# Patient Record
Sex: Male | Born: 1965 | Race: White | Hispanic: No | Marital: Married | Smoking: Never smoker
Health system: Southern US, Community
[De-identification: ages and names within clinical notes are randomized; demographics above are authoritative.]

## PROBLEM LIST (undated history)

## (undated) DIAGNOSIS — E119 Type 2 diabetes mellitus without complications: Secondary | ICD-10-CM

## (undated) DIAGNOSIS — I10 Essential (primary) hypertension: Secondary | ICD-10-CM

---

## 1998-10-22 ENCOUNTER — Ambulatory Visit (HOSPITAL_COMMUNITY): Admission: RE | Admit: 1998-10-22 | Discharge: 1998-10-22 | Payer: Self-pay | Admitting: Obstetrics and Gynecology

## 2001-06-10 ENCOUNTER — Emergency Department (HOSPITAL_COMMUNITY): Admission: EM | Admit: 2001-06-10 | Discharge: 2001-06-10 | Payer: Self-pay | Admitting: Emergency Medicine

## 2001-06-12 ENCOUNTER — Emergency Department (HOSPITAL_COMMUNITY): Admission: EM | Admit: 2001-06-12 | Discharge: 2001-06-12 | Payer: Self-pay | Admitting: *Deleted

## 2016-05-25 ENCOUNTER — Inpatient Hospital Stay (HOSPITAL_COMMUNITY)
Admission: EM | Admit: 2016-05-25 | Discharge: 2016-05-29 | DRG: 880 | Disposition: A | Discharge status: Home / Self Care | Payer: Federal, State, Local not specified - PPO | Source: Intra-hospital | Attending: Psychiatry | Admitting: Psychiatry

## 2016-05-25 ENCOUNTER — Encounter (HOSPITAL_COMMUNITY): Payer: Self-pay | Admitting: *Deleted

## 2016-05-25 DIAGNOSIS — E559 Vitamin D deficiency, unspecified: Secondary | ICD-10-CM | POA: Diagnosis present

## 2016-05-25 DIAGNOSIS — F05 Delirium due to known physiological condition: Secondary | ICD-10-CM | POA: Diagnosis present

## 2016-05-25 DIAGNOSIS — Z880 Allergy status to penicillin: Secondary | ICD-10-CM | POA: Diagnosis not present

## 2016-05-25 DIAGNOSIS — F22 Delusional disorders: Secondary | ICD-10-CM | POA: Diagnosis present

## 2016-05-25 DIAGNOSIS — F3130 Bipolar disorder, current episode depressed, mild or moderate severity, unspecified: Secondary | ICD-10-CM

## 2016-05-25 DIAGNOSIS — Z818 Family history of other mental and behavioral disorders: Secondary | ICD-10-CM | POA: Diagnosis not present

## 2016-05-25 DIAGNOSIS — E291 Testicular hypofunction: Secondary | ICD-10-CM | POA: Diagnosis present

## 2016-05-25 DIAGNOSIS — E1165 Type 2 diabetes mellitus with hyperglycemia: Secondary | ICD-10-CM | POA: Diagnosis present

## 2016-05-25 DIAGNOSIS — J45909 Unspecified asthma, uncomplicated: Secondary | ICD-10-CM | POA: Diagnosis present

## 2016-05-25 DIAGNOSIS — S0083XA Contusion of other part of head, initial encounter: Secondary | ICD-10-CM | POA: Diagnosis present

## 2016-05-25 DIAGNOSIS — N390 Urinary tract infection, site not specified: Secondary | ICD-10-CM | POA: Diagnosis present

## 2016-05-25 DIAGNOSIS — Z7984 Long term (current) use of oral hypoglycemic drugs: Secondary | ICD-10-CM

## 2016-05-25 DIAGNOSIS — E119 Type 2 diabetes mellitus without complications: Secondary | ICD-10-CM

## 2016-05-25 DIAGNOSIS — F411 Generalized anxiety disorder: Secondary | ICD-10-CM | POA: Diagnosis present

## 2016-05-25 DIAGNOSIS — Z833 Family history of diabetes mellitus: Secondary | ICD-10-CM

## 2016-05-25 DIAGNOSIS — Z79899 Other long term (current) drug therapy: Secondary | ICD-10-CM | POA: Diagnosis not present

## 2016-05-25 DIAGNOSIS — F32A Depression, unspecified: Secondary | ICD-10-CM | POA: Diagnosis present

## 2016-05-25 DIAGNOSIS — S0012XA Contusion of left eyelid and periocular area, initial encounter: Secondary | ICD-10-CM | POA: Diagnosis present

## 2016-05-25 DIAGNOSIS — E785 Hyperlipidemia, unspecified: Secondary | ICD-10-CM | POA: Diagnosis present

## 2016-05-25 DIAGNOSIS — F329 Major depressive disorder, single episode, unspecified: Secondary | ICD-10-CM | POA: Diagnosis present

## 2016-05-25 DIAGNOSIS — F319 Bipolar disorder, unspecified: Secondary | ICD-10-CM | POA: Clinically undetermined

## 2016-05-25 DIAGNOSIS — I10 Essential (primary) hypertension: Secondary | ICD-10-CM | POA: Diagnosis present

## 2016-05-25 LAB — GLUCOSE, CAPILLARY
Glucose-Capillary: 123 mg/dL — ABNORMAL HIGH (ref 65–99)
Glucose-Capillary: 100 mg/dL — ABNORMAL HIGH (ref 65–99)

## 2016-05-25 MED ORDER — GLUCERNA SHAKE PO LIQD
237.0000 mL | Freq: Three times a day (TID) | ORAL | Status: DC
  Administered 2016-05-25 – 2016-05-29 (×12): 237 mL via ORAL

## 2016-05-25 MED ORDER — INSULIN ASPART 100 UNIT/ML ~~LOC~~ SOLN: 0.0000 [IU] | Freq: Every day | SUBCUTANEOUS | Status: DC

## 2016-05-25 MED ORDER — PNEUMOCOCCAL VAC POLYVALENT 25 MCG/0.5ML IJ INJ
0.5000 mL | INJECTION | INTRAMUSCULAR | Status: AC
  Administered 2016-05-26: 0.5 mL via INTRAMUSCULAR

## 2016-05-25 MED ORDER — HALOPERIDOL 5 MG PO TABS
2.5000 mg | ORAL_TABLET | Freq: Every day | ORAL | Status: DC
  Administered 2016-05-26 – 2016-05-29 (×4): 2.5 mg via ORAL
  Filled 2016-05-25 (×7): qty 1

## 2016-05-25 MED ORDER — OLANZAPINE 5 MG PO TABS
5.0000 mg | ORAL_TABLET | Freq: Four times a day (QID) | ORAL | Status: DC | PRN
  Administered 2016-05-26: 5 mg via ORAL
  Filled 2016-05-25: qty 2

## 2016-05-25 MED ORDER — OLANZAPINE 10 MG IM SOLR: 10.0000 mg | Freq: Three times a day (TID) | INTRAMUSCULAR | Status: DC | PRN

## 2016-05-25 MED ORDER — FAMOTIDINE 20 MG PO TABS
20.0000 mg | ORAL_TABLET | Freq: Two times a day (BID) | ORAL | Status: DC
  Administered 2016-05-25 – 2016-05-29 (×8): 20 mg via ORAL
  Filled 2016-05-25 (×12): qty 1

## 2016-05-25 MED ORDER — HYDROCHLOROTHIAZIDE 12.5 MG PO CAPS
12.5000 mg | ORAL_CAPSULE | Freq: Every day | ORAL | Status: DC
  Administered 2016-05-26: 12.5 mg via ORAL
  Filled 2016-05-25 (×2): qty 1

## 2016-05-25 MED ORDER — OLANZAPINE 10 MG IM SOLR: 5.0000 mg | Freq: Four times a day (QID) | INTRAMUSCULAR | Status: DC | PRN

## 2016-05-25 MED ORDER — OLANZAPINE 10 MG PO TABS: 10.0000 mg | ORAL_TABLET | Freq: Three times a day (TID) | ORAL | Status: DC | PRN

## 2016-05-25 MED ORDER — DULOXETINE HCL 60 MG PO CPEP
60.0000 mg | ORAL_CAPSULE | Freq: Every day | ORAL | Status: DC
  Filled 2016-05-25 (×2): qty 1

## 2016-05-25 MED ORDER — ALUM & MAG HYDROXIDE-SIMETH 200-200-20 MG/5ML PO SUSP
30.0000 mL | ORAL | Status: DC | PRN
  Administered 2016-05-28: 30 mL via ORAL
  Filled 2016-05-25: qty 30

## 2016-05-25 MED ORDER — INSULIN ASPART 100 UNIT/ML ~~LOC~~ SOLN
0.0000 [IU] | Freq: Three times a day (TID) | SUBCUTANEOUS | Status: DC
  Administered 2016-05-26 – 2016-05-28 (×2): 3 [IU] via SUBCUTANEOUS

## 2016-05-25 MED ORDER — HYDROXYZINE HCL 25 MG PO TABS
25.0000 mg | ORAL_TABLET | Freq: Four times a day (QID) | ORAL | Status: DC | PRN
  Administered 2016-05-26 – 2016-05-29 (×3): 25 mg via ORAL
  Filled 2016-05-25 (×3): qty 1

## 2016-05-25 MED ORDER — SULFAMETHOXAZOLE-TRIMETHOPRIM 800-160 MG PO TABS
1.0000 | ORAL_TABLET | Freq: Two times a day (BID) | ORAL | Status: DC
  Administered 2016-05-25 – 2016-05-29 (×9): 1 via ORAL
  Filled 2016-05-25 (×15): qty 1

## 2016-05-25 MED ORDER — HALOPERIDOL 5 MG PO TABS
2.5000 mg | ORAL_TABLET | Freq: Every evening | ORAL | Status: DC
  Administered 2016-05-25 – 2016-05-26 (×2): 2.5 mg via ORAL
  Filled 2016-05-25 (×3): qty 1

## 2016-05-25 MED ORDER — VITAMIN D3 25 MCG (1000 UNIT) PO TABS
1000.0000 [IU] | ORAL_TABLET | Freq: Every day | ORAL | Status: DC
  Administered 2016-05-26 – 2016-05-29 (×4): 1000 [IU] via ORAL
  Filled 2016-05-25 (×7): qty 1

## 2016-05-25 MED ORDER — MAGNESIUM HYDROXIDE 400 MG/5ML PO SUSP: 30.0000 mL | Freq: Every day | ORAL | Status: DC | PRN

## 2016-05-25 MED ORDER — METFORMIN HCL ER 500 MG PO TB24
500.0000 mg | ORAL_TABLET | Freq: Every day | ORAL | Status: DC
  Administered 2016-05-26 – 2016-05-29 (×4): 500 mg via ORAL
  Filled 2016-05-25 (×6): qty 1

## 2016-05-25 MED ORDER — DULOXETINE HCL 30 MG PO CPEP
30.0000 mg | ORAL_CAPSULE | Freq: Every day | ORAL | Status: DC
  Administered 2016-05-26 – 2016-05-29 (×4): 30 mg via ORAL
  Filled 2016-05-25 (×7): qty 1

## 2016-05-25 MED ORDER — TRAZODONE HCL 50 MG PO TABS
50.0000 mg | ORAL_TABLET | Freq: Every evening | ORAL | Status: DC | PRN
  Administered 2016-05-25 – 2016-05-27 (×3): 50 mg via ORAL
  Filled 2016-05-25 (×3): qty 1

## 2016-05-25 MED ORDER — ACETAMINOPHEN 325 MG PO TABS: 650.0000 mg | ORAL_TABLET | Freq: Four times a day (QID) | ORAL | Status: DC | PRN
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
## 2016-05-25 NOTE — [facility] Suicide Risk Assessment (Signed)
[facility]   Nursing information obtained from:    Demographic factors:    Current Mental Status:    Loss Factors:    Historical Factors:    Risk Reduction Factors:     Total Time spent with patient: 30 minutes Principal Problem: Bipolar disord, crnt epsd depress, mild or mod severt, unsp (HCC) Diagnosis:   Patient Active Problem List   Diagnosis Date Noted  . Bipolar disord, crnt epsd depress, mild or mod severt, unsp (HCC) [F31.30] 05/25/2016  . GAD (generalized anxiety disorder) [F41.1] 05/25/2016  . Vitamin D deficiency [E55.9] 05/25/2016  . Hypogonadism male [E29.1] 05/25/2016  . Diabetes mellitus (HCC) [E11.9] 05/25/2016  . Hyperlipidemia [E78.5] 05/25/2016  . Essential hypertension [I10] 05/25/2016   Subjective Data: Please see H&P.   Continued Clinical Symptoms:  Alcohol Use Disorder Identification Test Final Score (AUDIT): 0 The "Alcohol Use Disorders Identification Test", Guidelines for Use in Primary Care, Second Edition.  World Science writerHealth Organization [facility](WHO). Score between 0-7:  no or low risk or alcohol related problems. Score between 8-15:  moderate risk of alcohol related problems. Score between 16-19:  high risk of alcohol related problems. Score 20 or above:  warrants further diagnostic evaluation for alcohol dependence and treatment.   CLINICAL FACTORS:   Previous Psychiatric Diagnoses and Treatments Medical Diagnoses and Treatments/Surgeries   Musculoskeletal: Strength & Muscle Tone: within normal limits Gait & Station: seen in bed - per staff noted as normal gait Patient leans: N/A  Psychiatric Specialty Exam: Physical Exam  ROS  Blood pressure 106/65, pulse (!) 115, temperature 98 F (36.7 C), temperature source Oral, resp. rate 18, height 5' 11.25" (1.81 m), weight 105.3 kg (232 lb 4 oz).Body mass index is 32.17 kg/m.            Please see H&P.                                                COGNITIVE FEATURES THAT CONTRIBUTE TO RISK:  Closed-mindedness, Polarized thinking and Thought constriction (tunnel vision)    SUICIDE RISK:   Moderate:  Frequent suicidal ideation with limited intensity, and duration, some specificity in terms of plans, no associated intent, good self-control, limited dysphoria/symptomatology, some risk factors present, and identifiable protective factors, including available and accessible social support.   PLAN OF CARE: Please see H&P.   I certify that inpatient services furnished can reasonably be expected to improve the patient's condition.  Niralya Ohanian, MD 05/25/2016, 2:14 PM
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
## 2016-05-25 NOTE — Progress Notes (Signed)
Did not attend group 

## 2016-05-25 NOTE — [facility] Suicide Risk Assessment (Signed)
[facility] INPATIENT:  Family/Significant Other Suicide Prevention Education  Suicide Prevention Education:  Education Completed; No one has been identified by the patient as the family member/significant other with whom the patient will be residing, and identified as the person(s) who will aid the patient in the event of a mental health crisis (suicidal ideations/suicide attempt).  With written consent from the patient, the family member/significant other has been provided the following suicide prevention education, prior to the and/or following the discharge of the patient.  The suicide prevention education provided includes the following:  Suicide risk factors  Suicide prevention and interventions  National Suicide Hotline telephone number  [facility] assessment telephone number  [facility] Emergency Assistance 911  [facility] and/or Residential Mobile Crisis Unit telephone number  Request made of family/significant other to:  Remove weapons (e.g., guns, rifles, knives), all items previously/currently identified as safety concern.    Remove drugs/medications (over-the-counter, prescriptions, illicit drugs), all items previously/currently identified as a safety concern.  The family member/significant other verbalizes understanding of the suicide prevention education information provided.  The family member/significant other agrees to remove the items of safety concern listed above. The patient did not endorse SI at the time of admission, nor did the patient c/o SI during the stay here.  SPE not required.   Ida RogueRodney B Cassidie Veiga 05/25/2016, 2:25 PM

## 2016-05-26 ENCOUNTER — Encounter (HOSPITAL_COMMUNITY): Payer: Self-pay | Admitting: Psychiatry

## 2016-05-26 DIAGNOSIS — I10 Essential (primary) hypertension: Secondary | ICD-10-CM

## 2016-05-26 LAB — COMPREHENSIVE METABOLIC PANEL
ALT: 51 U/L (ref 17–63)
AST: 51 U/L — ABNORMAL HIGH (ref 15–41)
Albumin: 4.4 g/dL (ref 3.5–5.0)
Alkaline Phosphatase: 68 U/L (ref 38–126)
Anion gap: 8 (ref 5–15)
BUN: 19 mg/dL (ref 6–20)
CO2: 29 mmol/L (ref 22–32)
Calcium: 9.8 mg/dL (ref 8.9–10.3)
Chloride: 103 mmol/L (ref 101–111)
Creatinine, Ser: 1.43 mg/dL — ABNORMAL HIGH (ref 0.61–1.24)
GFR calc Af Amer: >60 mL/min (ref >60)
GFR calc non Af Amer: 55 mL/min — ABNORMAL LOW (ref >60)
Glucose, Bld: 123 mg/dL — ABNORMAL HIGH (ref 65–99)
Potassium: 4.2 mmol/L (ref 3.5–5.1)
Sodium: 140 mmol/L (ref 135–145)
Total Bilirubin: 0.8 mg/dL (ref 0.3–1.2)
Total Protein: 7.7 g/dL (ref 6.5–8.1)

## 2016-05-26 LAB — GLUCOSE, CAPILLARY
Glucose-Capillary: 122 mg/dL — ABNORMAL HIGH (ref 65–99)
Glucose-Capillary: 104 mg/dL — ABNORMAL HIGH (ref 65–99)
Glucose-Capillary: 99 mg/dL (ref 65–99)
Glucose-Capillary: 136 mg/dL — ABNORMAL HIGH (ref 65–99)

## 2016-05-26 LAB — URINALYSIS W MICROSCOPIC (NOT AT ARMC)
Bilirubin Urine: NEGATIVE
Glucose, UA: NEGATIVE mg/dL
Hgb urine dipstick: NEGATIVE
Ketones, ur: 15 mg/dL — AB
Leukocytes, UA: NEGATIVE
Nitrite: NEGATIVE
Protein, ur: NEGATIVE mg/dL
RBC / HPF: NONE SEEN RBC/hpf (ref 0–5)
Specific Gravity, Urine: 1.019 (ref 1.005–1.030)
WBC, UA: NONE SEEN WBC/hpf (ref 0–5)
pH: 6 (ref 5.0–8.0)

## 2016-05-26 LAB — RPR: RPR Ser Ql: NONREACTIVE

## 2016-05-26 LAB — CBC WITH DIFFERENTIAL/PLATELET
Basophils Absolute: 0.1 10*3/uL (ref 0.0–0.1)
Basophils Relative: 1 %
Eosinophils Absolute: 0.2 10*3/uL (ref 0.0–0.7)
Eosinophils Relative: 2 %
HCT: 46 % (ref 39.0–52.0)
Hemoglobin: 16.2 g/dL (ref 13.0–17.0)
Lymphocytes Relative: 29 %
Lymphs Abs: 2.3 10*3/uL (ref 0.7–4.0)
MCH: 32.2 pg (ref 26.0–34.0)
MCHC: 35.2 g/dL (ref 30.0–36.0)
MCV: 91.5 fL (ref 78.0–100.0)
Monocytes Absolute: 0.9 10*3/uL (ref 0.1–1.0)
Monocytes Relative: 12 %
Neutro Abs: 4.5 10*3/uL (ref 1.7–7.7)
Neutrophils Relative %: 56 %
Platelets: 316 10*3/uL (ref 150–400)
RBC: 5.03 MIL/uL (ref 4.22–5.81)
RDW: 13.5 % (ref 11.5–15.5)
WBC: 7.9 10*3/uL (ref 4.0–10.5)

## 2016-05-26 LAB — VITAMIN B12: Vitamin B-12: 431 pg/mL (ref 180–914)

## 2016-05-26 LAB — LIPID PANEL
Cholesterol: 170 mg/dL (ref 0–200)
HDL: 28 mg/dL — ABNORMAL LOW (ref >40)
LDL Cholesterol: 110 mg/dL — ABNORMAL HIGH (ref 0–99)
Total CHOL/HDL Ratio: 6.1 RATIO
Triglycerides: 159 mg/dL — ABNORMAL HIGH (ref <150)
VLDL: 32 mg/dL (ref 0–40)

## 2016-05-26 LAB — FOLATE: Folate: 16.9 ng/mL (ref >5.9)

## 2016-05-26 LAB — TSH: TSH: 1.539 u[IU]/mL (ref 0.350–4.500)

## 2016-05-26 MED ORDER — METOPROLOL TARTRATE 25 MG PO TABS
25.0000 mg | ORAL_TABLET | Freq: Two times a day (BID) | ORAL | Status: DC
  Administered 2016-05-26 – 2016-05-29 (×6): 25 mg via ORAL
  Filled 2016-05-26 (×10): qty 1

## 2016-05-26 MED ORDER — LISINOPRIL 5 MG PO TABS
5.0000 mg | ORAL_TABLET | Freq: Every day | ORAL | Status: DC
  Administered 2016-05-26 – 2016-05-29 (×4): 5 mg via ORAL
  Filled 2016-05-26 (×7): qty 1

## 2016-05-26 NOTE — Progress Notes (Signed)
Phone consult  [facility] Aggie for Dr Elna BreslowEappen, regarding elevated creatinine 1.43, and hypertension. Patient has a history of diabetes mellitus Patient has been taking HCTZ 12.5 mg daily along with lisinopril 20 mg daily at home.  In the hospital he was not prescribed his medications, mild elevation of blood pressure 134/91, heart rate 117 recommended to start metoprolol 25 mg twice a day, along with lisinopril 5 mg daily. Okay to hold HCTZ. Recheck BMP in 3 days  Patient can be discharged on metoprolol 25 mg twice a day, lisinopril 5 mg daily. Discontinue HCTZ, lisinopril 20 mg. He will need outpatient follow-up with nephrology.

## 2016-05-26 NOTE — Progress Notes (Signed)
Recreation Therapy Notes  INPATIENT RECREATION THERAPY ASSESSMENT  Patient Details Name: Ethan Arnold MRN: [id number] DOB: Jun 04, 1966 Today's Date: 05/26/2016  Patient Stressors: Family, Other (Comment) (Buying a car)  Pt stated he didn't know why he was here. Pt stated his relationship with his siblings and mother has been a stressor.  Coping Skills:   Isolate, Arguments, Avoidance, Music, Sports  Pt stated he likes to play softball.  Personal Challenges: Anger, Communication, Concentration, Decision-Making, Expressing Yourself, Relationships, Self-Esteem/Confidence, Stress Management  Leisure Interests (2+):  Sports - Baseball, Individual - Reading  Awareness of Community Resources:  Yes  Community Resources:  Library, Recreation Center  Current Use: No  If no, Barriers?: Other (Comment) Stage manager(Procastinator)  Patient Strengths:  People person, decent athlete  Patient Identified Areas of Improvement:  Talking when meeting new person; verbalizing feelings  Current Recreation Participation:  Twice a week  Patient Goal for Hospitalization:  "Hurry up and get out"  [place]  [place] Residence[place]   Current SI (including self-harm):  No  Current HI:  No  Consent to Intern Participation: N/A   Caroll RancherMarjette Jammie Clink, LRT/CTRS  Caroll RancherLindsay, Sunita Demond A 05/26/2016, 12:40 PM

## 2016-05-26 NOTE — Progress Notes (Signed)
DAR NOTE: Patient presents with anxious affect and depressed mood.  Pt appear confused, pt was observed responding to internal stimuli, standing at the corner in the day room singing and dancing during group meeting. Denies pain, auditory and visual hallucinations.  Rates depression at 2, hopelessness at 1, and anxiety at 3.  Maintained on routine safety checks.  Medications given as prescribed.  Support and encouragement offered as needed. States goal for today is " getting well."  Patient observed socializing with peers in the dayroom.  Offered no complaint.

## 2016-05-26 NOTE — [facility] Counselor (Signed)
Adult Comprehensive Assessment  Patient ID: Ethan Arnold, male   DOB: Jan 25, 1966, 50 y.o.   MRN: [id number]  Information Source: Information source: Patient  Current Stressors:  Family Relationships: Distant relationship with his mother and siblings.  Physical health (include injuries & life threatening diseases): Patient charts shows that he has an UTI, uncontrolled diabetes, and vitamin D deficiancy. Bereavement / Loss: Patient reported that his father passed away in 27.  Living/Environment/Situation:  Living Arrangements: Spouse/significant other Living conditions (as described by patient or guardian): Patient described his living conditions as "great".  How long has patient lived in current situation?: "roughly 20 years" What is atmosphere in current home: Supportive, Loving, Comfortable  Family History:  Marital status: Married Number of Years Married: 19 What types of issues is patient dealing with in the relationship?: None  Are you sexually active?: Yes What is your sexual orientation?: Heterosexual  Has your sexual activity been affected by drugs, alcohol, medication, or emotional stress?: NO  Does patient have children?: Yes (Patient has a 98 year old step-son. ) How many children?: 1 How is patient's relationship with their children?: Patient reported having a decent relationship with his stepson. Patient does not have any biological children.   Childhood History:  By whom was/is the patient raised?: Both parents Additional childhood history information: Patient's father passed away in 55.  Description of patient's relationship with caregiver when they were a child: Patient described his relationship with his cargivers during his childhood as "great".  Patient's description of current relationship with people who raised him/her: Patient stated that his relationship with his mother is currently "rocky".  Does patient have siblings?: Yes Number of Siblings:  4 Description of patient's current relationship with siblings: Patient described his relationship with his siblings as "distant".  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 - [employer]  How long has patient been employed?: 27 years  Patient's job has been impacted by current illness: Yes Describe how patient's job has been impacted: "By being in the hosiptal"  What is the longest time patient has a held a job?: 27 years  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  Has patient ever been in the [employer]?: No Has patient ever served in combat?: No Did You Receive Any Psychiatric Treatment/Services While in Equities trader?: No Are There Guns or Other Weapons in Your Home?: No  Financial Resources:   Financial resources: Income from employment, Private insurance Does patient have a representative payee or guardian?: No  Alcohol/Substance Abuse:   What has been your use of drugs/alcohol within the last 12 months?: Patient denies any alcohol or substance abuse  If attempted suicide, did drugs/alcohol play a role in this?: No Alcohol/Substance Abuse Treatment Hx: Denies past history Has alcohol/substance abuse ever caused legal problems?: No  Social Support System:   Patient's Community Support System: Good Describe Community Support System: "My wife is my support, and she is great"  Type of faith/religion:  Christianity  How does  patient's faith help to cope with current illness?: Prayer; Attending church   Leisure/Recreation:   Leisure and Hobbies: Watching professional baseball and listening to blue grass music   Strengths/Needs:   What things does the patient do well?: "Im a people person and I have a good sense of humor"  In what areas does patient struggle / problems for patient: "I need to read the bible more and get a better understanding of everything"   Discharge Plan:   Does patient have access to transportation?: Yes (Uncertain, "my wife will figure something out". ) Will patient be returning to same living situation after discharge?: Yes Currently receiving community mental health services: No If no, would patient like referral for services when discharged?: Yes (What county?) [facility]( ) Does patient have financial barriers related to discharge medications?: No  Summary/Recommendations:    Summary and Recommendations (to be completed by the evaluator): Ethan Arnold is a 50 year old, Caucasian male who is diagnosed with R/O Bipolar disorder (HCC): R/O Delirium , multifactorial ( UTI , diabetes uncontrolled, vitamin d def and so on). Ethan Arnold presented to the hospital involuntarily [facility]Jody Os[facility]. Ethan Arnold's wife reported that he was behaving bizzarely  and had been manic 3 days prior to [facility]. During the PSA, Ethan Arnold exhibited rapid speech and flight of ideas, but was still able to provide valid information for the assessment. Ethan Arnold stated that this was the first time anything like this has happened to him, and that he feels better today as some of his confusion continues to clears. He stated that he would like to be referred to a psychiatrist outside of the hospital so that he can "stay on the right track". Ethan Arnold can benefit from crisis stabilization, medication management, therapeutic milieu and referral services.  Ethan Arnold. 05/26/2016

## 2016-05-26 NOTE — Tx Team (Signed)
Interdisciplinary Treatment and Diagnostic Plan Update  05/26/2016 Time of Session: 10:44 AM  Ethan Arnold MRN: [id number]  Principal Diagnosis: Bipolar disorder ([place])  Secondary Diagnoses: Principal Problem:   Bipolar disorder ([place]) Active Problems:   GAD (generalized anxiety disorder)   Vitamin D deficiency   Hypogonadism male   Diabetes mellitus ([place])   Hyperlipidemia   Essential hypertension   UTI (urinary tract infection)   Current Medications:  Current Facility-Administered Medications  Medication Dose Route Frequency Provider Last Rate Last Dose  . acetaminophen (TYLENOL) tablet 650 mg  650 mg Oral Q6H PRN Ursula Alert, MD      . alum & mag hydroxide-simeth (MAALOX/MYLANTA) 200-200-20 MG/5ML suspension 30 mL  30 mL Oral Q4H PRN Ursula Alert, MD      . cholecalciferol (VITAMIN D) tablet 1,000 Units  1,000 Units Oral Daily Ursula Alert, MD   1,000 Units at 05/26/16 8638  . DULoxetine (CYMBALTA) DR capsule 30 mg  30 mg Oral Daily Saramma Eappen, MD   30 mg at 05/26/16 0821  . famotidine (PEPCID) tablet 20 mg  20 mg Oral BID Ursula Alert, MD   20 mg at 05/26/16 0821  . feeding supplement (GLUCERNA SHAKE) (GLUCERNA SHAKE) liquid 237 mL  237 mL Oral TID BM Saramma Eappen, MD   237 mL at 05/26/16 1022  . haloperidol (HALDOL) tablet 2.5 mg  2.5 mg Oral QPM Saramma Eappen, MD   2.5 mg at 05/25/16 1702  . haloperidol (HALDOL) tablet 2.5 mg  2.5 mg Oral Daily Saramma Eappen, MD   2.5 mg at 05/26/16 0820  . hydrochlorothiazide (MICROZIDE) capsule 12.5 mg  12.5 mg Oral Daily Saramma Eappen, MD   12.5 mg at 05/26/16 0821  . hydrOXYzine (ATARAX/VISTARIL) tablet 25 mg  25 mg Oral Q6H PRN Saramma Eappen, MD      . insulin aspart (novoLOG) injection 0-20 Units  0-20 Units Subcutaneous TID WC Ursula Alert, MD   3 Units at 05/26/16 0728  . insulin aspart (novoLOG) injection 0-5 Units  0-5 Units Subcutaneous QHS Ursula Alert, MD   Stopped at 05/25/16 2200  . magnesium hydroxide (MILK  OF MAGNESIA) suspension 30 mL  30 mL Oral Daily PRN Ursula Alert, MD      . metFORMIN (GLUCOPHAGE-XR) 24 hr tablet 500 mg  500 mg Oral Q breakfast Ursula Alert, MD   500 mg at 05/26/16 0820  . OLANZapine (ZYPREXA) tablet 5 mg  5 mg Oral Q6H PRN Ursula Alert, MD   5 mg at 05/26/16 1039   Or  . OLANZapine (ZYPREXA) injection 5 mg  5 mg Intramuscular Q6H PRN Ursula Alert, MD      . sulfamethoxazole-trimethoprim (BACTRIM DS,SEPTRA DS) 800-160 MG per tablet 1 tablet  1 tablet Oral Q12H Ursula Alert, MD   1 tablet at 05/26/16 0821  . traZODone (DESYREL) tablet 50 mg  50 mg Oral QHS PRN Ursula Alert, MD   50 mg at 05/25/16 2127    PTA Medications: Prescriptions Prior to Admission  Medication Sig Dispense Refill Last Dose  . albuterol (PROAIR HFA) 108 (90 Base) MCG/ACT inhaler Inhale 2 puffs into the lungs every 4 (four) hours as needed for wheezing or shortness of breath.      . Azelastine HCl 0.15 % SOLN Place 1 spray into the nose daily.      Marland Kitchen desloratadine (CLARINEX) 5 MG tablet Take 5 mg by mouth daily.      . DULoxetine (CYMBALTA) 60 MG capsule Take 60 mg by mouth  daily.      . hydrochlorothiazide (MICROZIDE) 12.5 MG capsule Take 12.5 mg by mouth daily.      Marland Kitchen HYDROcodone-homatropine (HYCODAN) 5-1.5 MG/5ML syrup Take 5 mLs by mouth at bedtime as needed for cough.      Marland Kitchen lisinopril (PRINIVIL,ZESTRIL) 40 MG tablet Take 20 mg by mouth daily.      . metFORMIN (GLUCOPHAGE-XR) 500 MG 24 hr tablet Take 500 mg by mouth daily before breakfast.      . mometasone (NASONEX) 50 MCG/ACT nasal spray Place 2 sprays into the nose daily.      . ranitidine (ZANTAC) 300 MG tablet Take 300 mg by mouth daily.      Marland Kitchen testosterone cypionate (DEPO-TESTOSTERONE) 200 MG/ML injection Inject 100 mg into the muscle See admin instructions. Take every 3 weeks.     . traZODone (DESYREL) 100 MG tablet Take 1/2-1 tablet po prn insomnia     . Vitamin D, Ergocalciferol, (DRISDOL) 50000 units CAPS capsule Take 50,000  Units by mouth every 7 (seven) days.        Treatment Modalities: Medication Management, Group therapy, Case management,  1 to 1 session with clinician, Psychoeducation, Recreational therapy.   Physician Treatment Plan for Primary Diagnosis: Bipolar disorder ([place]) Long Term Goal(s): Improvement in symptoms so as ready for discharge  Short Term Goals: Ability to verbalize feelings will improve  Medication Management: Evaluate patient's response, side effects, and tolerance of medication regimen.  Therapeutic Interventions: 1 to 1 sessions, Unit Group sessions and Medication administration.  Evaluation of Outcomes: Not Met  Physician Treatment Plan for Secondary Diagnosis: Principal Problem:   Bipolar disorder ([place]) Active Problems:   GAD (generalized anxiety disorder)   Vitamin D deficiency   Hypogonadism male   Diabetes mellitus ([place])   Hyperlipidemia   Essential hypertension   UTI (urinary tract infection)   Long Term Goal(s): Improvement in symptoms so as ready for discharge  Short Term Goals: Ability to identify and develop effective coping behaviors will improve  Medication Management: Evaluate patient's response, side effects, and tolerance of medication regimen.  Therapeutic Interventions: 1 to 1 sessions, Unit Group sessions and Medication administration.  Evaluation of Outcomes: Not Met   RN Treatment Plan for Primary Diagnosis: Bipolar disorder ([place]) Long Term Goal(s): Knowledge of disease and therapeutic regimen to maintain health will improve  Short Term Goals: Ability to remain free from injury will improve and Compliance with prescribed medications will improve  Medication Management: RN will administer medications as ordered by provider, will assess and evaluate patient's response and provide education to patient for prescribed medication. RN will report any adverse and/or side effects to prescribing provider.  Therapeutic Interventions: 1 on 1 counseling  sessions, Psychoeducation, Medication administration, Evaluate responses to treatment, Monitor vital signs and CBGs as ordered, Perform/monitor CIWA, COWS, AIMS and Fall Risk screenings as ordered, Perform wound care treatments as ordered.  Evaluation of Outcomes: Not Met   LCSW Treatment Plan for Primary Diagnosis: Bipolar disorder [facility]) Long Term Goal(s): Safe transition to appropriate next level of care at discharge, Engage patient in therapeutic group addressing interpersonal concerns.  Short Term Goals: Engage patient in aftercare planning with referrals and resources and Facilitate acceptance of mental health diagnosis and concerns  Therapeutic Interventions: Assess for all discharge needs, 1 to 1 time with Social worker, Explore available resources and support systems, Assess for adequacy in community support network, Educate family and significant other(s) on suicide prevention, Complete Psychosocial Assessment, Interpersonal group therapy.  Evaluation of Outcomes:  Not Met   Progress in Treatment: Attending groups: Yes Participating in groups: Yes Taking medication as prescribed: Yes, MD continues to assess for medication changes as needed Toleration medication: Yes, no side effects reported at this time Family/Significant other contact made: No  Patient understands diagnosis: Yes, patient requested for a referral to see a psychiatrist once he leaves hospital.  Discussing patient identified problems/goals with staff: Yes Medical problems stabilized or resolved: Yes Denies suicidal/homicidal ideation:  Issues/concerns per patient self-inventory: None Other: N/A  New problem(s) identified: None identified at this time.   New Short Term/Long Term Goal(s): None identified at this time.   Discharge Plan or Barriers: Return home, follow up outpatient   Reason for Continuation of Hospitalization: Anxiety Delusions  Hallucinations Mania Medication stabilization  Estimated  Length of Stay: 3-5 days  Attendees: Patient: 05/26/2016  10:44 AM  Physician: Dr. Shea Evans 05/26/2016  10:44 AM  Nursing:  Opal Sidles  05/26/2016  10:44 AM  RN Care Manager: Lars Pinks 05/26/2016  10:44 AM  Social Worker: Ripley Fraise, LCSW 05/26/2016  10:44 AM  Recreational Therapist: Winfield Cunas 05/26/2016  10:44 AM  Other: Radonna Ricker, Social Work Intern  05/26/2016  10:44 AM  Other:  05/26/2016  10:44 AM  Other: 05/26/2016  10:44 AM    Scribe for Treatment Team: Radonna Ricker, Social Work Intern 05/26/2016 10:44 AM

## 2016-05-26 NOTE — Progress Notes (Signed)
D: Ethan Arnold has been resting in his room tonight. He was asleep during group this evening. Denies SI/HI/AVH at this time. Contracts for safety.   A: Encouragement and support given. Q15 minute room checks for patient safety. Medications administered as prescribed.   R: Continue to monitor for patient safety and medication effectiveness.

## 2016-05-26 NOTE — Progress Notes (Signed)
[facility] MD Progress Note  05/26/2016 1:24 PM Ethan Arnold  MRN:  [id number] Subjective: Patient states " I remember everything that happened , but I still have trouble determining what was real and what was not. Nothing unusual was going on except that we were going to buy a new car. I was not hearing voices , but in was seeing things ,like seeing people walking /"  Objective:Erica L Hurleyis a 50 y.o.caucasian male who is married , lives with his wife - Caren Griffins of 22 + years , is employed as a Development worker, community carrier , has a hx of anxiety disorder , who has never been admitted at an IP mental health facility before , presented to [facility] hospital ED after having some bizarre behavior , sleep issues and agitation at home.  Patient seen and chart reviewed.Discussed patient with treatment team.  Pt today is seen as alert, oriented x4 , appears calm and appropriate. Pt denies any AH , but still continues to have some derealization sx - unable to differentiate between real and unreal. Pt seen as more visible in milieu . Pt per staff is compliant on medications , does appear "confused " at times. Continues to need encouragement and support.   Principal Problem:R/O  Bipolar disorder ([place]); R/O Delirium , multifactorial ( UTI , diabetes uncontrolled, vitamin d def and so on) Diagnosis:   Patient Active Problem List   Diagnosis Date Noted  . GAD (generalized anxiety disorder) [F41.1] 05/25/2016  . Vitamin D deficiency [E55.9] 05/25/2016  . Hypogonadism male [E29.1] 05/25/2016  . Diabetes mellitus ([place]) [E11.9] 05/25/2016  . Hyperlipidemia [E78.5] 05/25/2016  . Essential hypertension [I10] 05/25/2016  . UTI (urinary tract infection) [N39.0] 05/25/2016  . Bipolar disorder ([place]) [F31.9] 05/25/2016   Total Time spent with patient: 25 minutes  Past Psychiatric History: Please see H&P.   Past Medical History: Please see H&P.  Family History:  Family History  Problem Relation Age of Onset  . Anxiety  disorder Mother   . Diabetes Father   . Depression Father   . Anxiety disorder Other   . Mental illness Paternal Grandmother    Family Psychiatric  History: Patient reports that his father had severe depression , was withdrawn and isolative at times, his grand mother was in a mental health institute most of her life since she had a hx of becoming very aggressive, he however does not know the diagnosis.  Social History: Please see H&P.  History  Alcohol use Not on file     History  Drug use: Unknown    Social History   Social History  . Marital status: Married    Spouse name: N/A  . Number of children: N/A  . Years of education: N/A   Social History Main Topics  . Smoking status: Never Smoker  . Smokeless tobacco: Never Used  . Alcohol use None  . Drug use: Unknown  . Sexual activity: Not Asked   Other Topics Concern  . None   Social History Narrative  . None   Additional Social History:    Pain Medications: See PTA material Prescriptions: Cholecalciferol, Cinsulin 2, Cymbalta, Grape seed and Resveratrol, Hydrocholrothiazide, Claritin D 12, Metformin, Rantidine HCI, Testosterone Cypionate. Over the Counter: See PTA material History of alcohol / drug use?: No history of alcohol / drug abuse (Denies)                    Sleep: Fair  Appetite:  Fair  Current Medications: Current Facility-Administered  Medications  Medication Dose Route Frequency Provider Last Rate Last Dose  . acetaminophen (TYLENOL) tablet 650 mg  650 mg Oral Q6H PRN Ursula Alert, MD      . alum & mag hydroxide-simeth (MAALOX/MYLANTA) 200-200-20 MG/5ML suspension 30 mL  30 mL Oral Q4H PRN Ursula Alert, MD      . cholecalciferol (VITAMIN D) tablet 1,000 Units  1,000 Units Oral Daily Ursula Alert, MD   1,000 Units at 05/26/16 7858  . DULoxetine (CYMBALTA) DR capsule 30 mg  30 mg Oral Daily Juliany Daughety, MD   30 mg at 05/26/16 0821  . famotidine (PEPCID) tablet 20 mg  20 mg Oral BID  Ursula Alert, MD   20 mg at 05/26/16 0821  . feeding supplement (GLUCERNA SHAKE) (GLUCERNA SHAKE) liquid 237 mL  237 mL Oral TID BM Arthella Headings, MD   237 mL at 05/26/16 1022  . haloperidol (HALDOL) tablet 2.5 mg  2.5 mg Oral QPM Darlen Gledhill, MD   2.5 mg at 05/25/16 1702  . haloperidol (HALDOL) tablet 2.5 mg  2.5 mg Oral Daily Cassadee Vanzandt, MD   2.5 mg at 05/26/16 0820  . hydrochlorothiazide (MICROZIDE) capsule 12.5 mg  12.5 mg Oral Daily Lailynn [place], MD   12.5 mg at 05/26/16 0821  . hydrOXYzine (ATARAX/VISTARIL) tablet 25 mg  25 mg Oral Q6H PRN Rishon Thilges, MD      . insulin aspart (novoLOG) injection 0-20 Units  0-20 Units Subcutaneous TID WC Ursula Alert, MD   3 Units at 05/26/16 0728  . insulin aspart (novoLOG) injection 0-5 Units  0-5 Units Subcutaneous QHS Ursula Alert, MD   Stopped at 05/25/16 2200  . magnesium hydroxide (MILK OF MAGNESIA) suspension 30 mL  30 mL Oral Daily PRN Ursula Alert, MD      . metFORMIN (GLUCOPHAGE-XR) 24 hr tablet 500 mg  500 mg Oral Q breakfast Ursula Alert, MD   500 mg at 05/26/16 0820  . OLANZapine (ZYPREXA) tablet 5 mg  5 mg Oral Q6H PRN Ursula Alert, MD   5 mg at 05/26/16 1039   Or  . OLANZapine (ZYPREXA) injection 5 mg  5 mg Intramuscular Q6H PRN Ursula Alert, MD      . sulfamethoxazole-trimethoprim (BACTRIM DS,SEPTRA DS) 800-160 MG per tablet 1 tablet  1 tablet Oral Q12H Ursula Alert, MD   1 tablet at 05/26/16 0821  . traZODone (DESYREL) tablet 50 mg  50 mg Oral QHS PRN Ursula Alert, MD   50 mg at 05/25/16 2127    Lab Results:  Results for orders placed or performed during the hospital encounter of 05/25/16 (from the past 48 hour(s))  Glucose, capillary     Status: Abnormal   Collection Time: 05/25/16  1:18 PM  Result Value Ref Range   Glucose-Capillary 123 (H) 65 - 99 mg/dL  Glucose, capillary     Status: Abnormal   Collection Time: 05/25/16  4:59 PM  Result Value Ref Range   Glucose-Capillary 100 (H) 65 - 99 mg/dL   Glucose, capillary     Status: Abnormal   Collection Time: 05/26/16  6:10 AM  Result Value Ref Range   Glucose-Capillary 122 (H) 65 - 99 mg/dL  CBC with Differential/Platelet     Status: None   Collection Time: 05/26/16  6:38 AM  Result Value Ref Range   WBC 7.9 4.0 - 10.5 K/uL   RBC 5.03 4.22 - 5.81 MIL/uL   Hemoglobin 16.2 13.0 - 17.0 g/dL   HCT 46.0 39.0 - 52.0 %  MCV 91.5 78.0 - 100.0 fL   MCH 32.2 26.0 - 34.0 pg   MCHC 35.2 30.0 - 36.0 g/dL   RDW 13.5 11.5 - 15.5 %   Platelets 316 150 - 400 K/uL   Neutrophils Relative % 56 %   Neutro Abs 4.5 1.7 - 7.7 K/uL   Lymphocytes Relative 29 %   Lymphs Abs 2.3 0.7 - 4.0 K/uL   Monocytes Relative 12 %   Monocytes Absolute 0.9 0.1 - 1.0 K/uL   Eosinophils Relative 2 %   Eosinophils Absolute 0.2 0.0 - 0.7 K/uL   Basophils Relative 1 %   Basophils Absolute 0.1 0.0 - 0.1 K/uL    Comment: Performed at [facility]  Comprehensive metabolic panel     Status: Abnormal   Collection Time: 05/26/16  6:38 AM  Result Value Ref Range   Sodium 140 135 - 145 mmol/L   Potassium 4.2 3.5 - 5.1 mmol/L   Chloride 103 101 - 111 mmol/L   CO2 29 22 - 32 mmol/L   Glucose, Bld 123 (H) 65 - 99 mg/dL   BUN 19 6 - 20 mg/dL   Creatinine, Ser 1.43 (H) 0.61 - 1.24 mg/dL   Calcium 9.8 8.9 - 10.3 mg/dL   Total Protein 7.7 6.5 - 8.1 g/dL   Albumin 4.4 3.5 - 5.0 g/dL   AST 51 (H) 15 - 41 U/L   ALT 51 17 - 63 U/L   Alkaline Phosphatase 68 38 - 126 U/L   Total Bilirubin 0.8 0.3 - 1.2 mg/dL   GFR calc non Af Amer 55 (L) >60 mL/min   GFR calc Af Amer >60 >60 mL/min    Comment: (NOTE) The eGFR has been calculated using the CKD EPI equation. This calculation has not been validated in all clinical situations. eGFR's persistently <60 mL/min signify possible Chronic Kidney Disease.    Anion gap 8 5 - 15    Comment: Performed at [facility]  Lipid panel     Status: Abnormal   Collection Time: 05/26/16  6:38 AM  Result  Value Ref Range   Cholesterol 170 0 - 200 mg/dL   Triglycerides 159 (H) <150 mg/dL   HDL 28 (L) >40 mg/dL   Total CHOL/HDL Ratio 6.1 RATIO   VLDL 32 0 - 40 mg/dL   LDL Cholesterol 110 (H) 0 - 99 mg/dL    Comment:        Total Cholesterol/HDL:CHD Risk Coronary Heart Disease Risk Table                     Men   Women  1/2 Average Risk   3.4   3.3  Average Risk       5.0   4.4  2 X Average Risk   9.6   7.1  3 X Average Risk  23.4   11.0        Use the calculated Patient Ratio above and the CHD Risk Table to determine the patient's CHD Risk.        ATP III CLASSIFICATION (LDL):  <100     mg/dL   Optimal  100-129  mg/dL   Near or Above                    Optimal  130-159  mg/dL   Borderline  160-189  mg/dL   High  >190     mg/dL   Very High Performed at [facility]  Hospital   Vitamin B12     Status: None   Collection Time: 05/26/16  6:38 AM  Result Value Ref Range   Vitamin B-12 431 180 - 914 pg/mL    Comment: (NOTE) This assay is not validated for testing neonatal or myeloproliferative syndrome specimens for Vitamin B12 levels. Performed at [facility]   Folate     Status: None   Collection Time: 05/26/16  6:38 AM  Result Value Ref Range   Folate 16.9 >5.9 ng/mL    Comment: Performed at [facility]  Urinalysis with microscopic (not at [facility])     Status: Abnormal   Collection Time: 05/26/16  7:06 AM  Result Value Ref Range   Color, Urine YELLOW YELLOW   APPearance CLEAR CLEAR   Specific Gravity, Urine 1.019 1.005 - 1.030   pH 6.0 5.0 - 8.0   Glucose, UA NEGATIVE NEGATIVE mg/dL   Hgb urine dipstick NEGATIVE NEGATIVE   Bilirubin Urine NEGATIVE NEGATIVE   Ketones, ur 15 (A) NEGATIVE mg/dL   Protein, ur NEGATIVE NEGATIVE mg/dL   Nitrite NEGATIVE NEGATIVE   Leukocytes, UA NEGATIVE NEGATIVE   WBC, UA NONE SEEN 0 - 5 WBC/hpf   RBC / HPF NONE SEEN 0 - 5 RBC/hpf   Bacteria, UA RARE (A) NONE SEEN   Squamous Epithelial / LPF 0-5 (A) NONE SEEN    Comment:  Performed at [facility]  Glucose, capillary     Status: Abnormal   Collection Time: 05/26/16 12:07 PM  Result Value Ref Range   Glucose-Capillary 104 (H) 65 - 99 mg/dL    Blood Alcohol level:  No results found for: [facility]  Metabolic Disorder Labs: No results found for: HGBA1C, MPG No results found for: PROLACTIN Lab Results  Component Value Date   CHOL 170 05/26/2016   TRIG 159 (H) 05/26/2016   HDL 28 (L) 05/26/2016   CHOLHDL 6.1 05/26/2016   VLDL 32 05/26/2016   LDLCALC 110 (H) 05/26/2016    Physical Findings: AIMS:  , ,  ,  ,    CIWA:    COWS:     Musculoskeletal: Strength & Muscle Tone: within normal limits Gait & Station: normal Patient leans: N/A  Psychiatric Specialty Exam: Physical Exam  Review of Systems  Psychiatric/Behavioral: The patient is nervous/anxious.   All other systems reviewed and are negative.   Blood pressure 125/81, pulse (!) 117, temperature 98.1 F (36.7 C), temperature source Oral, resp. rate 20, height 5' 11.25" (1.81 m), weight 105.3 kg (232 lb 4 oz), SpO2 100 %.Body mass index is 32.17 kg/m.  General Appearance: Casual  Eye Contact:  Fair  Speech:  Normal Rate  Volume:  Normal  Mood:  Anxious  Affect:  Appropriate  Thought Process:  Goal Directed and Descriptions of Associations: Circumstantial  Orientation:  Full (Time, Place, and Person)  Thought Content:  Rumination  Suicidal Thoughts:  No  Homicidal Thoughts:  No  Memory:  Immediate;   Fair Recent;   Fair Remote;   Fair  Judgement:  Fair  Insight:  Shallow  Psychomotor Activity:  Normal  Concentration:  Concentration: Fair and Attention Span: Fair  Recall:  [employer] of Knowledge:  Fair  Language:  Fair  Akathisia:  No  Handed:  Right  AIMS (if indicated):     Assets:  Desire for Improvement Financial Resources/Insurance Housing Intimacy Leisure Time Physical Health Social Support Talents/Skills Transportation  ADL's:  Intact  Cognition:   WNL  Sleep:  Number of Hours: 6     Treatment Plan Summary: Kimberly L Hurleyis a 50 y.o.caucasian male who is married , lives with his wife - Caren Griffins of 72 + years , is employed as a Development worker, community carrier , has a hx of anxiety disorder , who has never been admitted at an IP mental health facility before , presented to [facility] hospital ED after having some bizarre behavior , sleep issues and agitation at home.  Patient today seen as alert, oriented , continues to report some confusion on and off about what is real and what is not . Pt will need to be observed on the unit . Daily contact with patient to assess and evaluate symptoms and progress in treatment and Medication management Will continue reduced dose of Cymbalta 30 mg po daily for affective sx- since unknown if this is a true manic episode . However per wife patient has been on it since 15 years or so without any adverse effects. Will continue Haldol 2.5 mg po bid for psychosis, mood lability. Will continue Trazodone 50 mg po qhs prn for sleep. Will make available PRN medications as per agitation protocol. Will continue Bactrim 800 mg po q12 h for UTI - UA - leukocytes and wbc. Will encourage PO fluids , pending Uclx. Will hold Hydrodiuril 12.5 mg po daily for HTN- patient has an elevated creatinine level .  Will continue Metformin 500 mg po daily for DM. Will continue CBGs , SSI as per unit protocol. Will not restart Cinsulin - pt was on it for hyperglycemia .  Will continue Vitamin D 1000 unit daily po for vitamin D  Deficiency. Will continue to monitor vitals ,medication compliance and treatment side effects while patient is here.  Will monitor for medical issues as well as call consult as needed.  Reviewed labs CBC - wbc - 15.2, neutrophils - 81.4, creatinine slightly elevated at 1.4, CT head w/o contrast - negative for any acute pathology  ,repeat CBC- wbc - wnl , CMP- creatinine - still at 1.46 , AST - 51 , TSH pending , lipid panel- wnl  ,pending  hba1c, pl as well as EKG for qtc- wnl . CSW will continue working on disposition.  Patient to participate in therapeutic milieu  Gurjit Loconte, MD 05/26/2016, 1:24 PM

## 2016-05-26 NOTE — [facility] Group Notes (Signed)
[facility] LCSW Group Therapy  05/26/2016 2:25 PM   Type of Therapy:  Group Therapy   Participation Level:  Engaged  Participation Quality:  Attentive  Affect:  Appropriate   Cognitive:  Alert   Insight:  Engaged  Engagement in Therapy:  Improving   Modes of Intervention:  Education, Exploration, Socialization   Summary of Progress/Problems: [place] was invited to group session, chose not to attend.   Onalee HuaDavid from [employer] was here to tell his story of recovery, inform patients about [facility] and play his guitar.   Baldo DaubJolan Hayden Kihara 05/26/2016 2:25 PM

## 2016-05-26 NOTE — [facility] Suicide Risk Assessment (Signed)
[facility] INPATIENT:  Family/Significant Other Suicide Prevention Education  Suicide Prevention Education:  Education Completed;No one  has been identified by the patient as the family member/significant other with whom the patient will be residing, and identified as the person(s) who will aid the patient in the event of a mental health crisis (suicidal ideations/suicide attempt).  With written consent from the patient, the family member/significant other has been provided the following suicide prevention education, prior to the and/or following the discharge of the patient.  The suicide prevention education provided includes the following:  Suicide risk factors  Suicide prevention and interventions  National Suicide Hotline telephone number  [facility] telephone number  [facility] 911  [facility] and/or Residential Mobile Crisis Unit telephone number  Request made of family/significant other to:  Remove weapons (e.g., guns, rifles, knives), all items previously/currently identified as safety concern.    Remove drugs/medications (over-the-counter, prescriptions, illicit drugs), all items previously/currently identified as a safety concern.  The family member/significant other verbalizes understanding of the suicide prevention education information provided.  The family member/significant other agrees to remove the items of safety concern listed above. The patient did not endorse SI at the time of admission, nor did the patient c/o SI during the stay here.  SPE not required    Ethan [facility] 05/26/2016, 10:50 AM

## 2016-05-26 NOTE — Progress Notes (Signed)
Recreation Therapy Notes  Date: 05/26/16 Time: 1000 Location: 500 Hall Dayroom  Group Topic: Communication, Team Building, Problem Solving  Goal Area(s) Addresses:  Patient will effectively work with peer towards shared goal.  Patient will identify skill used to make activity successful.  Patient will identify how skills used during activity can be used to reach post d/c goals.   Intervention: STEM Activity   Activity: Pipe Cleaner Tower. In teams, patients were asked to build the tallest freestanding tower possible out of 15 pipe cleaners. Systematically resources were removed, for example patient ability to use both hands and patient ability to verbally communicate.    Education: Social Skills, Discharge Planning.   Education Outcome: Acknowledges education/In group clarification offered/Needs additional education.   Clinical Observations/Feedback: Pt did not attend group.    Pranish Akhavan, LRT/CTRS         Ethan Arnold A 05/26/2016 12:19 PM 

## 2016-05-27 LAB — GLUCOSE, CAPILLARY
Glucose-Capillary: 107 mg/dL — ABNORMAL HIGH (ref 65–99)
Glucose-Capillary: 109 mg/dL — ABNORMAL HIGH (ref 65–99)
Glucose-Capillary: 110 mg/dL — ABNORMAL HIGH (ref 65–99)
Glucose-Capillary: 84 mg/dL (ref 65–99)

## 2016-05-27 LAB — URINE CULTURE: Culture: NO GROWTH

## 2016-05-27 LAB — HEMOGLOBIN A1C
Hgb A1c MFr Bld: 6.6 % — ABNORMAL HIGH (ref 4.8–5.6)
Mean Plasma Glucose: 143 mg/dL

## 2016-05-27 LAB — PROLACTIN: Prolactin: 20.8 ng/mL — ABNORMAL HIGH (ref 4.0–15.2)

## 2016-05-27 MED ORDER — BENZTROPINE MESYLATE 0.5 MG PO TABS
0.5000 mg | ORAL_TABLET | Freq: Every day | ORAL | Status: DC
  Administered 2016-05-27 – 2016-05-28 (×2): 0.5 mg via ORAL
  Filled 2016-05-27 (×4): qty 1

## 2016-05-27 MED ORDER — HALOPERIDOL 5 MG PO TABS
5.0000 mg | ORAL_TABLET | Freq: Every day | ORAL | Status: DC
  Administered 2016-05-27 – 2016-05-28 (×2): 5 mg via ORAL
  Filled 2016-05-27 (×4): qty 1

## 2016-05-27 NOTE — Progress Notes (Signed)
Recreation Therapy Notes  Date: 05/27/16 Time: 1000 Location: 500 Hall Dayroom  Group Topic: Self-Esteem  Goal Area(s) Addresses:  Patient will identify positive ways to increase self-esteem. Patient will verbalize benefit of increased self-esteem.  Behavioral Response: Engaged  Intervention: Theme park managerColored pencils, worksheets with blank face  Activity: How I See Me.  LRT talked with patients about what self-esteem is and why it is important.  LRT then passed out worksheets with blank faces on them.  Patients were to draw or write how they see themselves.  Patients were to focus on the positive aspects of themselves that make them who they are.  Education:  Self-Esteem, Building control surveyorDischarge Planning.   Education Outcome: Acknowledges education/In group clarification offered/Needs additional education  Clinical Observations/Feedback: Pt focused on his looks by saying he has "little hair, big ears, big nose, decent teeth and a decent smile".  When asked by LRT to come up with things outside of his looks, pt stated he was a decent athlete and has a decent sense of humor.    Caroll RancherMarjette Najla Aughenbaugh, LRT/CTRS      Caroll RancherLindsay, Nikie Cid A 05/27/2016 11:14 AM

## 2016-05-27 NOTE — Progress Notes (Signed)
D:  Patient's self inventory sheet, patient sleeps good, sleep medication is helpful.  Good appetite, normal energy level, good concentration. Rated depression and hopeless 3, anxiety 4.  Denied withdrawals.  Denied SI.  Patient stated he has felt dizzy in past 24 hours.  Denied pain.  Goal is to feel better ohysically and mentally.  Plans to get out of bed and attend groups today.  Plans to discharge home. A:  Medications administered per MD orders.  Emotional support and encouragement given patient. R:  Denied SI and HI, contracts for safety.  Denied A/V hallucinations.  Safety maintained with 15 minute checks.

## 2016-05-27 NOTE — Plan of Care (Signed)
Problem: Education: Goal: Ability to state activities that reduce stress will improve Outcome: Progressing Nurse discussed depression/anxiety/coping skills with patient.    

## 2016-05-27 NOTE — [facility] Group Notes (Signed)
Type of Therapy: Group Therapy   Participation Level: Engaged  Participation Quality: Attentive  Affect: Flat   Cognitive: Alert   Insight: Engaged  Engagement in Therapy: Improving   Modes of Intervention: Education, Exploration, Socialization  Summary of Progress/Problems: Ethan Arnold was engaged throughout, stayed entire time. Ethan Arnold stated that he fluctuates between feeling strong and weak since his stay here in the hospital. Ethan Arnold reported that he generally felt strong before his recent incident. He stated that he was still a little confused, but hopes that the confusion will clear up before he returns to work.  Played Wheel of Fortune, spelling the word weak. Group members were asked to identify if they felt weak or strong and then what that meant to them

## 2016-05-27 NOTE — Progress Notes (Signed)
[facility] MD Progress Note  05/27/2016 11:34 AM Ethan Arnold  MRN:  [id number] Subjective: Patient states " I feel tired . I am ok."   Objective:Ethan L Hurleyis a 50 y.o.caucasian male who is married , lives with his wife - Ethan [facility] of 53 + years , is employed as a Development worker, community carrier , has a hx of anxiety disorder , who has never been admitted at an IP mental health facility before , presented to [facility] ED after having some bizarre behavior , sleep issues and agitation at home.  Patient seen and chart reviewed.Discussed patient with treatment team.  Pt today is seen as alert, oriented x4 , appears calm and appropriate and less anxious . Pt continues to deny AH - however per staff has periods when he is seen as anxious , hyperactive and at times seen as picking things in the air as though her has VH. He denies it today. Pt seen as more visible in milieu . Pt does seem tired often , sleepy at times .Continues to need encouragement and support.   Principal Problem:R/O  Bipolar disorder ([place]); R/O Delirium , multifactorial ( UTI , diabetes uncontrolled, vitamin d def and so on) Diagnosis:   Patient Active Problem List   Diagnosis Date Noted  . GAD (generalized anxiety disorder) [F41.1] 05/25/2016  . Vitamin D deficiency [E55.9] 05/25/2016  . Hypogonadism male [E29.1] 05/25/2016  . Diabetes mellitus ([place]) [E11.9] 05/25/2016  . Hyperlipidemia [E78.5] 05/25/2016  . Essential hypertension [I10] 05/25/2016  . UTI (urinary tract infection) [N39.0] 05/25/2016  . Bipolar disorder ([place]) [F31.9] 05/25/2016   Total Time spent with patient: 25 minutes  Past Psychiatric History: Please see H&P.   Past Medical History: Please see H&P.  Family History:  Family History  Problem Relation Age of Onset  . Anxiety disorder Mother   . Diabetes Father   . Depression Father   . Anxiety disorder Other   . Mental illness Paternal Grandmother    Family Psychiatric  History: Patient reports that his  father had severe depression , was withdrawn and isolative at times, his grand mother was in a mental health institute most of her life since she had a hx of becoming very aggressive, he however does not know the diagnosis.  Social History: Please see H&P.  History  Alcohol use Not on file     History  Drug use: Unknown    Social History   Social History  . Marital status: Married    Spouse name: N/A  . Number of children: N/A  . Years of education: N/A   Social History Main Topics  . Smoking status: Never Smoker  . Smokeless tobacco: Never Used  . Alcohol use None  . Drug use: Unknown  . Sexual activity: Not Asked   Other Topics Concern  . None   Social History Narrative  . None   Additional Social History:    Pain Medications: See PTA material Prescriptions: Cholecalciferol, Cinsulin 2, Cymbalta, Grape seed and Resveratrol, Hydrocholrothiazide, Claritin D 12, Metformin, Rantidine HCI, Testosterone Cypionate. Over the Counter: See PTA material History of alcohol / drug use?: No history of alcohol / drug abuse (Denies)                    Sleep: Fair  Appetite:  Fair  Current Medications: Current Facility-Administered Medications  Medication Dose Route Frequency Provider Last Rate Last Dose  . acetaminophen (TYLENOL) tablet 650 mg  650 mg Oral Q6H PRN Safwan Tomei,  MD      . alum & mag hydroxide-simeth (MAALOX/MYLANTA) 200-200-20 MG/5ML suspension 30 mL  30 mL Oral Q4H PRN Rakel Junio, MD      . benztropine (COGENTIN) tablet 0.5 mg  0.5 mg Oral QHS Jermey Closs, MD      . cholecalciferol (VITAMIN D) tablet 1,000 Units  1,000 Units Oral Daily Ursula Alert, MD   1,000 Units at 05/27/16 0850  . DULoxetine (CYMBALTA) DR capsule 30 mg  30 mg Oral Daily Ursula Alert, MD   30 mg at 05/27/16 0843  . famotidine (PEPCID) tablet 20 mg  20 mg Oral BID Ursula Alert, MD   20 mg at 05/27/16 0843  . feeding supplement (GLUCERNA SHAKE) (GLUCERNA SHAKE) liquid  237 mL  237 mL Oral TID BM Farhad Burleson, MD   237 mL at 05/27/16 0925  . haloperidol (HALDOL) tablet 2.5 mg  2.5 mg Oral Daily Pardeep Pautz, MD   2.5 mg at 05/27/16 0844  . haloperidol (HALDOL) tablet 5 mg  5 mg Oral QHS Basia Mcginty, MD      . hydrOXYzine (ATARAX/VISTARIL) tablet 25 mg  25 mg Oral Q6H PRN Ursula Alert, MD   25 mg at 05/26/16 2213  . insulin aspart (novoLOG) injection 0-20 Units  0-20 Units Subcutaneous TID WC Ursula Alert, MD   3 Units at 05/26/16 0728  . insulin aspart (novoLOG) injection 0-5 Units  0-5 Units Subcutaneous QHS Ursula Alert, MD   Stopped at 05/25/16 2200  . lisinopril (PRINIVIL,ZESTRIL) tablet 5 mg  5 mg Oral Daily Encarnacion Slates, NP   5 mg at 05/27/16 5643  . magnesium hydroxide (MILK OF MAGNESIA) suspension 30 mL  30 mL Oral Daily PRN Ursula Alert, MD      . metFORMIN (GLUCOPHAGE-XR) 24 hr tablet 500 mg  500 mg Oral Q breakfast Ursula Alert, MD   500 mg at 05/27/16 0843  . metoprolol tartrate (LOPRESSOR) tablet 25 mg  25 mg Oral BID Encarnacion Slates, NP   25 mg at 05/27/16 0924  . OLANZapine (ZYPREXA) tablet 5 mg  5 mg Oral Q6H PRN Ursula Alert, MD   5 mg at 05/26/16 1039   Or  . OLANZapine (ZYPREXA) injection 5 mg  5 mg Intramuscular Q6H PRN Ursula Alert, MD      . sulfamethoxazole-trimethoprim (BACTRIM DS,SEPTRA DS) 800-160 MG per tablet 1 tablet  1 tablet Oral Q12H Ursula Alert, MD   1 tablet at 05/27/16 0844  . traZODone (DESYREL) tablet 50 mg  50 mg Oral QHS PRN Ursula Alert, MD   50 mg at 05/26/16 2213    Lab Results:  Results for orders placed or performed during the [facility] encounter of 05/25/16 (from the past 48 hour(s))  Glucose, capillary     Status: Abnormal   Collection Time: 05/25/16  1:18 PM  Result Value Ref Range   Glucose-Capillary 123 (H) 65 - 99 mg/dL  Glucose, capillary     Status: Abnormal   Collection Time: 05/25/16  4:59 PM  Result Value Ref Range   Glucose-Capillary 100 (H) 65 - 99 mg/dL  Glucose, capillary      Status: Abnormal   Collection Time: 05/26/16  6:10 AM  Result Value Ref Range   Glucose-Capillary 122 (H) 65 - 99 mg/dL  CBC with Differential/Platelet     Status: None   Collection Time: 05/26/16  6:38 AM  Result Value Ref Range   WBC 7.9 4.0 - 10.5 K/uL   RBC 5.03 4.22 -  5.81 MIL/uL   Hemoglobin 16.2 13.0 - 17.0 g/dL   HCT 46.0 39.0 - 52.0 %   MCV 91.5 78.0 - 100.0 fL   MCH 32.2 26.0 - 34.0 pg   MCHC 35.2 30.0 - 36.0 g/dL   RDW 13.5 11.5 - 15.5 %   Platelets 316 150 - 400 K/uL   Neutrophils Relative % 56 %   Neutro Abs 4.5 1.7 - 7.7 K/uL   Lymphocytes Relative 29 %   Lymphs Abs 2.3 0.7 - 4.0 K/uL   Monocytes Relative 12 %   Monocytes Absolute 0.9 0.1 - 1.0 K/uL   Eosinophils Relative 2 %   Eosinophils Absolute 0.2 0.0 - 0.7 K/uL   Basophils Relative 1 %   Basophils Absolute 0.1 0.0 - 0.1 K/uL    Comment: Performed at [facility]  Comprehensive metabolic panel     Status: Abnormal   Collection Time: 05/26/16  6:38 AM  Result Value Ref Range   Sodium 140 135 - 145 mmol/L   Potassium 4.2 3.5 - 5.1 mmol/L   Chloride 103 101 - 111 mmol/L   CO2 29 22 - 32 mmol/L   Glucose, Bld 123 (H) 65 - 99 mg/dL   BUN 19 6 - 20 mg/dL   Creatinine, Ser 1.43 (H) 0.61 - 1.24 mg/dL   Calcium 9.8 8.9 - 10.3 mg/dL   Total Protein 7.7 6.5 - 8.1 g/dL   Albumin 4.4 3.5 - 5.0 g/dL   AST 51 (H) 15 - 41 U/L   ALT 51 17 - 63 U/L   Alkaline Phosphatase 68 38 - 126 U/L   Total Bilirubin 0.8 0.3 - 1.2 mg/dL   GFR calc non Af Amer 55 (L) >60 mL/min   GFR calc Af Amer >60 >60 mL/min    Comment: (NOTE) The eGFR has been calculated using the CKD EPI equation. This calculation has not been validated in all clinical situations. eGFR's persistently <60 mL/min signify possible Chronic Kidney Disease.    Anion gap 8 5 - 15    Comment: Performed at [facility]  Lipid panel     Status: Abnormal   Collection Time: 05/26/16  6:38 AM  Result Value Ref Range    Cholesterol 170 0 - 200 mg/dL   Triglycerides 159 (H) <150 mg/dL   HDL 28 (L) >40 mg/dL   Total CHOL/HDL Ratio 6.1 RATIO   VLDL 32 0 - 40 mg/dL   LDL Cholesterol 110 (H) 0 - 99 mg/dL    Comment:        Total Cholesterol/HDL:CHD Risk Coronary Heart Disease Risk Table                     Men   Women  1/2 Average Risk   3.4   3.3  Average Risk       5.0   4.4  2 X Average Risk   9.6   7.1  3 X Average Risk  23.4   11.0        Use the calculated Patient Ratio above and the CHD Risk Table to determine the patient's CHD Risk.        ATP III CLASSIFICATION (LDL):  <100     mg/dL   Optimal  100-129  mg/dL   Near or Above                    Optimal  130-159  mg/dL   Borderline  160-189  mg/dL   High  >190     mg/dL   Very High Performed at [facility]   Hemoglobin A1c     Status: Abnormal   Collection Time: 05/26/16  6:38 AM  Result Value Ref Range   Hgb A1c MFr Bld 6.6 (H) 4.8 - 5.6 %    Comment: (NOTE)         Pre-diabetes: 5.7 - 6.4         Diabetes: >6.4         Glycemic control for adults with diabetes: <7.0    Mean Plasma Glucose 143 mg/dL    Comment: (NOTE) Performed At: [facility] [place], [place] [id number] Lindon Romp MD [id number] Performed at [facility]   Prolactin     Status: Abnormal   Collection Time: 05/26/16  6:38 AM  Result Value Ref Range   Prolactin 20.8 (H) 4.0 - 15.2 ng/mL    Comment: (NOTE) Performed At: [facility] [place], [place] [id number] Lindon Romp MD [id number] Performed at [facility]   Vitamin B12     Status: None   Collection Time: 05/26/16  6:38 AM  Result Value Ref Range   Vitamin B-12 431 180 - 914 pg/mL    Comment: (NOTE) This assay is not validated for testing neonatal or myeloproliferative syndrome specimens for Vitamin B12 levels. Performed at [facility]   Folate     Status: None   Collection Time:  05/26/16  6:38 AM  Result Value Ref Range   Folate 16.9 >5.9 ng/mL    Comment: Performed at [facility]  RPR     Status: None   Collection Time: 05/26/16  6:38 AM  Result Value Ref Range   RPR Ser Ql Non Reactive Non Reactive    Comment: (NOTE) Performed At: [facility] [place], [place] [id number] Lindon Romp MD [id number] Performed at [facility]   Urinalysis with microscopic (not at [facility])     Status: Abnormal   Collection Time: 05/26/16  7:06 AM  Result Value Ref Range   Color, Urine YELLOW YELLOW   APPearance CLEAR CLEAR   Specific Gravity, Urine 1.019 1.005 - 1.030   pH 6.0 5.0 - 8.0   Glucose, UA NEGATIVE NEGATIVE mg/dL   Hgb urine dipstick NEGATIVE NEGATIVE   Bilirubin Urine NEGATIVE NEGATIVE   Ketones, ur 15 (A) NEGATIVE mg/dL   Protein, ur NEGATIVE NEGATIVE mg/dL   Nitrite NEGATIVE NEGATIVE   Leukocytes, UA NEGATIVE NEGATIVE   WBC, UA NONE SEEN 0 - 5 WBC/hpf   RBC / HPF NONE SEEN 0 - 5 RBC/hpf   Bacteria, UA RARE (A) NONE SEEN   Squamous Epithelial / LPF 0-5 (A) NONE SEEN    Comment: Performed at [facility]  Urine culture     Status: None   Collection Time: 05/26/16  7:07 AM  Result Value Ref Range   Specimen Description      URINE, CLEAN CATCH Performed at [facility]    Special Requests      NONE Performed at [facility]    Culture NO GROWTH Performed at [facility]     Report Status 05/27/2016 FINAL   Glucose, capillary     Status: Abnormal   Collection Time: 05/26/16 12:07 PM  Result Value Ref Range   Glucose-Capillary 104 (H) 65 - 99 mg/dL  Glucose, capillary     Status: None   Collection Time: 05/26/16  5:40 PM  Result Value Ref Range   Glucose-Capillary 99 65 - 99 mg/dL  TSH     Status: None   Collection Time: 05/26/16  6:21 PM  Result Value Ref Range   TSH 1.539 0.350 - 4.500 uIU/mL    Comment: Performed at [facility]  Glucose, capillary     Status: Abnormal   Collection Time: 05/26/16  8:43 PM  Result Value Ref Range   Glucose-Capillary 136 (H) 65 - 99 mg/dL   Comment 1 Notify RN   Glucose, capillary     Status: Abnormal   Collection Time: 05/27/16  5:46 AM  Result Value Ref Range   Glucose-Capillary 107 (H) 65 - 99 mg/dL    Blood Alcohol level:  No results found for: [facility]  Metabolic Disorder Labs: Lab Results  Component Value Date   HGBA1C 6.6 (H) 05/26/2016   MPG 143 05/26/2016   Lab Results  Component Value Date   PROLACTIN 20.8 (H) 05/26/2016   Lab Results  Component Value Date   CHOL 170 05/26/2016   TRIG 159 (H) 05/26/2016   HDL 28 (L) 05/26/2016   CHOLHDL 6.1 05/26/2016   VLDL 32 05/26/2016   LDLCALC 110 (H) 05/26/2016    Physical Findings: AIMS:  , ,  ,  ,    CIWA:    COWS:     Musculoskeletal: Strength & Muscle Tone: within normal limits Gait & Station: normal Patient leans: N/A  Psychiatric Specialty Exam: Physical Exam  Nursing note and vitals reviewed.   Review of Systems  Psychiatric/Behavioral: The patient is nervous/anxious.   All other systems reviewed and are negative.   Blood pressure (!) 88/60, pulse (!) 57, temperature 98 F (36.7 C), temperature source Oral, resp. rate 16, height 5' 11.25" (1.81 m), weight 105.3 kg (232 lb 4 oz), SpO2 97 %.Body mass index is 32.17 kg/m.  General Appearance: Casual  Eye Contact:  Fair  Speech:  Normal Rate  Volume:  Normal  Mood:  Anxious  Affect:  Appropriate  Thought Process:  Goal Directed and Descriptions of Associations: Circumstantial  Orientation:  Full (Time, Place, and Person)  Thought Content:  Hallucinations: Visual, Paranoid Ideation and Rumination some VH as observed by staff   Suicidal Thoughts:  No  Homicidal Thoughts:  No  Memory:  Immediate;   Fair Recent;   Fair Remote;   Fair  Judgement:  Fair  Insight:  Shallow  Psychomotor Activity:  Normal  Concentration:   Concentration: Fair and Attention Span: Fair  Recall:  [employer] of Knowledge:  Fair  Language:  Fair  Akathisia:  No  Handed:  Right  AIMS (if indicated):     Assets:  Desire for Improvement Financial Resources/Insurance Housing Intimacy Leisure Time Physical Health Social Support Talents/Skills Transportation  ADL's:  Intact  Cognition:  WNL  Sleep:  Number of Hours: 6.75     Treatment Plan Summary: Stonewall L Hurleyis a 50 y.o.caucasian male who is married , lives with his wife - Ethan [facility] of 98 + years , is employed as a Development worker, community carrier , has a hx of anxiety disorder , who has never been admitted at an IP mental health facility before , presented to [facility] after having some bizarre behavior , sleep issues and agitation at home.  Patient today seen as alert, oriented , continues to report some anxiety on and off .  Pt continues to have periods when he appears confused and as responding to ? VH . Pt with elevated creatinine level as well as BP. His medications has been readjusted per family medicine consult yesterday.  Pt will need to be observed on the unit . Daily contact with patient to assess and evaluate symptoms and progress in treatment and Medication management Will continue reduced dose of Cymbalta 30 mg po daily for affective sx- since unknown if this is a true manic episode . However per wife patient has been on it since 15 years or so without any adverse effects. Will increase Haldol to 2.5 mg po daily and 5 mg po qhs for psychosis, mood lability. Will add Cogentin 0.5 mg po qhs for EPS. Will continue Trazodone 50 mg po qhs prn for sleep. Will make available PRN medications as per agitation protocol. Will continue Bactrim 800 mg po q12 h for UTI - UA - leukocytes and wbc. Will encourage PO fluids , pending Uclx. Discontinued Hydrodiuril 12.5 mg po daily for HTN- patient has an elevated creatinine level . Added Metoprolol 25 mg po bid , Lisinopril 5 mg po daily  for BP-Will repeat CMP in 2 days . Please see FM Consult note. Will continue Metformin 500 mg po daily for DM. Will continue CBGs , SSI as per unit protocol. Will not restart Cinsulin - pt was on it for hyperglycemia .  Will continue Vitamin D 1000 unit daily po for vitamin D  Deficiency. Will continue to monitor vitals ,medication compliance and treatment side effects while patient is here.  Will monitor for medical issues as well as call consult as needed.  Reviewed labs CBC - wbc - 15.2, neutrophils - 81.4, creatinine slightly elevated at 1.4, CT head w/o contrast - negative for any acute pathology  ,repeat CBC- wbc - wnl , CMP- creatinine - still at 1.46 , AST - 51 , TSH - wnl  , lipid panel- wnl , hba1c- 6.6 , pl- 20.8 , EKG for qtc- wnl . CSW will continue working on disposition.  Patient to participate in therapeutic milieu  Maximina Pirozzi, MD 05/27/2016, 11:34 AM

## 2016-05-27 NOTE — Progress Notes (Signed)
D: Pt could be paranoid-verbalized being confused, was however able to states date, day, place, time and person. Pt was guarded and withdrawn to room; was balled up in bed all evening. Pt denies depression, anxiety, pain, SI, HI or AVH; state, "I only feel confused sometimes"  A: Medications offered as prescribed.  Support, encouragement, and safe environment provided.  15-minute safety checks continue. R: Pt was med compliant.  Pt reluctantly attended wrap-up group and then back to bed. Safety checks continue.

## 2016-05-27 NOTE — Progress Notes (Signed)
Adult Psychoeducational Group Note  Date:  05/27/2016 Time:  9:48 PM  Group Topic/Focus:  Wrap-Up Group:   The focus of this group is to help patients review their daily goal of treatment and discuss progress on daily workbooks.   Participation Level:  Active  Participation Quality:  Appropriate  Affect:  Appropriate  Cognitive:  Appropriate  Insight: Appropriate  Engagement in Group:  Engaged  Modes of Intervention:  Activity  Additional Comments:  Pt appropriate at group Delos Haringhillips, Elsy Chiang A 05/27/2016, 9:48 PM

## 2016-05-28 DIAGNOSIS — F05 Delirium due to known physiological condition: Secondary | ICD-10-CM | POA: Clinically undetermined

## 2016-05-28 LAB — GLUCOSE, CAPILLARY
Glucose-Capillary: 105 mg/dL — ABNORMAL HIGH (ref 65–99)
Glucose-Capillary: 136 mg/dL — ABNORMAL HIGH (ref 65–99)
Glucose-Capillary: 147 mg/dL — ABNORMAL HIGH (ref 65–99)

## 2016-05-28 MED ORDER — TRAZODONE HCL 100 MG PO TABS
100.0000 mg | ORAL_TABLET | Freq: Every day | ORAL | Status: DC
  Administered 2016-05-28: 100 mg via ORAL
  Filled 2016-05-28 (×3): qty 1

## 2016-05-28 NOTE — Progress Notes (Signed)
Data. Patient denies SI/HI/AVH. Patient interacting well with staff and other patients. Multiple bruises and abrasions to face, head, hands and legs. Healing well. On his self assessment patient reports 2/10 for depression and hopelessness and 3/10 for anxiety. Patient's goal for today is: "Getting to go home". And he will accomplish this by: "being com[pliant and obedient". Did C/O anxiety in the early AM, but denies any later in the shift. Action. PRN for nausea was effective. Emotional support and encouragement offered. Education provided on medication, indications and side effect. Q 15 minute checks done for safety. Response. Safety on the unit maintained through 15 minute checks.  Medications taken as prescribed. Attended groups. Remained calm and appropriate through out shift.

## 2016-05-28 NOTE — Progress Notes (Signed)
Recreation Therapy Notes  Date: 05/28/16 Time: 1000 Location: 500 Hall Dayroom  Group Topic: Stress Management  Goal Area(s) Addresses:  Patient will verbalize importance of using healthy stress management.  Patient will identify positive emotions associated with healthy stress management.   Behavioral Response: Engaged  Intervention: Stress Management  Activity :  Deep breathing, [facility].  LRT introduced the stress management techniques of deep breathing and guided imagery.  LRT read scripts to allow patients to engage in the techniques.  Patients were to follow along as LRT read scripts.  Education:  Stress Management, Discharge Planning.   Education Outcome: Acknowledges edcuation/In group clarification offered/Needs additional education  Clinical Observations/Feedback:  Pt arrived late.  Pt sat through the guided imagery technique.  Pt stated that it was relaxing but a little to long for him.   Caroll RancherMarjette Marrion Finan, LRT/CTRS    Caroll RancherLindsay, Adiyah Lame A 05/28/2016 12:27 PM

## 2016-05-28 NOTE — Plan of Care (Signed)
Problem: Activity: Goal: Imbalance in normal sleep/wake cycle will improve Outcome: Not Progressing Patient only slept 3 hours last night.

## 2016-05-28 NOTE — [facility] Group Notes (Signed)
[facility] LCSW Group Therapy  05/28/2016  1:05 PM  Type of Therapy:  Group therapy  Participation Level:  Active  Participation Quality:  Attentive  Affect:  Flat  Cognitive:  Oriented  Insight:  Limited  Engagement in Therapy:  Limited  Modes of Intervention:  Discussion, Socialization  Summary of Progress/Problems:  Chaplain was here to lead a group on themes of hope and courage. Stayed the entire time, engaged throughout.  Talked a.bout care in terms of feeling peace and calm.  "Everyone has been real nice here, and they care a lot."  Unsophisticated  Daryel Geraldorth, Torian Quintero B 05/28/2016 1:29 PM

## 2016-05-28 NOTE — Progress Notes (Signed)
  [facility] Dis[facility] :  Will you be returning to the same living situation after discharge:  Yes,  home At discharge, do you have transportation home?: Yes,  wife Do you have the ability to pay for your medications: Yes,  insurance  Release of information consent forms completed and in the chart;  Patient's signature needed at discharge.  Patient to Follow up at: Follow-up Information    [facility] Follow up on 05/31/2016.   Why:  Monday at 9:45 AM bring ID, SS card, insurance card You will see a therapist who will open your chart for services.  They will give you an appointment with the psychiatrist for another date  Contact information: [address] [place], [phone]          Next level of care provider has access to [facility] Link:no  Safety Planning and Suicide Prevention discussed: Yes,  yes  Have you used any form of tobacco in the last 30 days? (Cigarettes, Smokeless Tobacco, Cigars, and/or Pipes): No  Has patient been referred to the Quitline?: N/A patient is not a smoker  Patient has been referred for addiction treatment: N/A  Ethan Arnold 05/28/2016, 3:14 PM

## 2016-05-28 NOTE — Progress Notes (Signed)
Patient attended the evening group session and was attentive to all discussion prompts from the writer. Patient shared his goal for the day, which was to improve and get better each day. This Clinical research associatewriter asked his to share two ways to accomplish his goal. Patient stated that being around people and to get some good rest while he has been here. Patient rated his day a 7 out of 10 and his affect was appropriate.

## 2016-05-28 NOTE — Progress Notes (Signed)
D: Pt at the time of assessment endorsed moderate anxiety, some paranoia and confusion; states, "I know I shouldn't be feeling this way but I feel confuse or something is not right." Pt looks anxious and continues to isolate self to room; remained in bed all evening. Pt denied SI, HI, AVH or pain. A: Medications offered as prescribed.  Support, encouragement, and safe environment provided.  15-minute safety checks continue. R: Pt was med compliant.  Pt attended karaoke group. Safety checks continue.

## 2016-05-28 NOTE — Progress Notes (Signed)
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 hospital ED after having some bizarre behavior , sleep issues and agitation at home.  Patient seen and chart reviewed.Discussed patient with treatment team.  Pt today is seen as alert, oriented x4 , appears calm and appropriate and less anxious . Pt continues to deny any psychosis or confusion . Pt does report some sleep issues last night , discussed making his trazodone scheduled tonight. Pt denies any new concerns at this time , is visible in milieu.   Spoke to Ronalee Belts - wife- # noted in ehr  Discussed patient's progress and disposition plan.Answered questions about his stay here and medications.    Principal Problem:R/O  Delirium due to multiple etiologies( UTI , diabetes uncontrolled, vitamin d def and so on) Diagnosis:   Patient Active Problem List   Diagnosis Date Noted  . Delirium due to multiple etiologies [F05] 05/28/2016  . GAD (generalized anxiety disorder) [F41.1] 05/25/2016  . Vitamin D deficiency [E55.9] 05/25/2016  . Hypogonadism male [E29.1] 05/25/2016  . Diabetes mellitus (HCC) [E11.9] 05/25/2016  . Hyperlipidemia [E78.5] 05/25/2016  . Essential hypertension [I10] 05/25/2016  . UTI (urinary tract infection) [N39.0] 05/25/2016   Total Time spent with patient: 25 minutes  Past Psychiatric History: Please see H&P.   Past Medical History: Please see H&P.  Family History:  Family History  Problem Relation Age of Onset  . Anxiety disorder Mother   . Diabetes Father   . Depression Father   . Anxiety disorder Other   . Mental illness Paternal Grandmother    Family  Psychiatric  History: Patient reports that his father had severe depression , was withdrawn and isolative at times, his grand mother was in a mental health institute most of her life since she had a hx of becoming very aggressive, he however does not know the diagnosis.  Social History: Please see H&P.  History  Alcohol use Not on file     History  Drug use: Unknown    Social History   Social History  . Marital status: Married    Spouse name: N/A  . Number of children: N/A  . Years of education: N/A   Social History Main Topics  . Smoking status: Never Smoker  . Smokeless tobacco: Never Used  . Alcohol use None  . Drug use: Unknown  . Sexual activity: Not Asked   Other Topics Concern  . None   Social History Narrative  . None   Additional Social History:    Pain Medications: See PTA material Prescriptions: Cholecalciferol, Cinsulin 2, Cymbalta, Grape seed and Resveratrol, Hydrocholrothiazide, Claritin D 12, Metformin, Rantidine HCI, Testosterone Cypionate. Over the Counter: See PTA material History of alcohol / drug use?: No history of alcohol / drug abuse (Denies)                    Sleep: Fair  Appetite:  Fair  Current Medications: Current Facility-Administered Medications  Medication Dose Route Frequency Provider Last Rate Last Dose  . acetaminophen (TYLENOL) tablet 650 mg  650 mg Oral Q6H PRN Jomarie Longs, MD      .  alum & mag hydroxide-simeth (MAALOX/MYLANTA) 200-200-20 MG/5ML suspension 30 mL  30 mL Oral Q4H PRN Jomarie Longs, MD   30 mL at 05/28/16 0825  . benztropine (COGENTIN) tablet 0.5 mg  0.5 mg Oral QHS Jomarie Longs, MD   0.5 mg at 05/27/16 2215  . cholecalciferol (VITAMIN D) tablet 1,000 Units  1,000 Units Oral Daily Jomarie Longs, MD   1,000 Units at 05/28/16 0830  . DULoxetine (CYMBALTA) DR capsule 30 mg  30 mg Oral Daily Jomarie Longs, MD   30 mg at 05/28/16 0836  . famotidine (PEPCID) tablet 20 mg  20 mg Oral BID Jomarie Longs, MD    20 mg at 05/28/16 0836  . feeding supplement (GLUCERNA SHAKE) (GLUCERNA SHAKE) liquid 237 mL  237 mL Oral TID BM Aspasia Rude, MD   237 mL at 05/28/16 1014  . haloperidol (HALDOL) tablet 2.5 mg  2.5 mg Oral Daily Lanesha Azzaro, MD   2.5 mg at 05/28/16 0836  . haloperidol (HALDOL) tablet 5 mg  5 mg Oral QHS Nyanna Heideman, MD   5 mg at 05/27/16 2215  . hydrOXYzine (ATARAX/VISTARIL) tablet 25 mg  25 mg Oral Q6H PRN Jomarie Longs, MD   25 mg at 05/27/16 2345  . insulin aspart (novoLOG) injection 0-20 Units  0-20 Units Subcutaneous TID WC Jomarie Longs, MD   3 Units at 05/26/16 0728  . insulin aspart (novoLOG) injection 0-5 Units  0-5 Units Subcutaneous QHS Jomarie Longs, MD   Stopped at 05/25/16 2200  . lisinopril (PRINIVIL,ZESTRIL) tablet 5 mg  5 mg Oral Daily Sanjuana Kava, NP   5 mg at 05/28/16 0825  . magnesium hydroxide (MILK OF MAGNESIA) suspension 30 mL  30 mL Oral Daily PRN Jomarie Longs, MD      . metFORMIN (GLUCOPHAGE-XR) 24 hr tablet 500 mg  500 mg Oral Q breakfast Jomarie Longs, MD   500 mg at 05/28/16 0826  . metoprolol tartrate (LOPRESSOR) tablet 25 mg  25 mg Oral BID Sanjuana Kava, NP   25 mg at 05/28/16 0826  . OLANZapine (ZYPREXA) tablet 5 mg  5 mg Oral Q6H PRN Jomarie Longs, MD   5 mg at 05/26/16 1039   Or  . OLANZapine (ZYPREXA) injection 5 mg  5 mg Intramuscular Q6H PRN Jomarie Longs, MD      . sulfamethoxazole-trimethoprim (BACTRIM DS,SEPTRA DS) 800-160 MG per tablet 1 tablet  1 tablet Oral Q12H Jomarie Longs, MD   1 tablet at 05/28/16 0836  . traZODone (DESYREL) tablet 100 mg  100 mg Oral QHS Jomarie Longs, MD        Lab Results:  Results for orders placed or performed during the hospital encounter of 05/25/16 (from the past 48 hour(s))  Glucose, capillary     Status: None   Collection Time: 05/26/16  5:40 PM  Result Value Ref Range   Glucose-Capillary 99 65 - 99 mg/dL  TSH     Status: None   Collection Time: 05/26/16  6:21 PM  Result Value Ref Range   TSH  1.539 0.350 - 4.500 uIU/mL    Comment: Performed at [facility]  Glucose, capillary     Status: Abnormal   Collection Time: 05/26/16  8:43 PM  Result Value Ref Range   Glucose-Capillary 136 (H) 65 - 99 mg/dL   Comment 1 Notify RN   Glucose, capillary     Status: Abnormal   Collection Time: 05/27/16  5:46 AM  Result Value Ref Range   Glucose-Capillary  107 (H) 65 - 99 mg/dL  Glucose, capillary     Status: Abnormal   Collection Time: 05/27/16 12:08 PM  Result Value Ref Range   Glucose-Capillary 109 (H) 65 - 99 mg/dL   Comment 1 Notify RN    Comment 2 Document in Chart   Glucose, capillary     Status: Abnormal   Collection Time: 05/27/16  4:53 PM  Result Value Ref Range   Glucose-Capillary 110 (H) 65 - 99 mg/dL   Comment 1 Notify RN    Comment 2 Document in Chart   Glucose, capillary     Status: None   Collection Time: 05/27/16  8:08 PM  Result Value Ref Range   Glucose-Capillary 84 65 - 99 mg/dL  Glucose, capillary     Status: Abnormal   Collection Time: 05/28/16  6:08 AM  Result Value Ref Range   Glucose-Capillary 105 (H) 65 - 99 mg/dL    Blood Alcohol level:  No results found for: [facility]  Metabolic Disorder Labs: Lab Results  Component Value Date   HGBA1C 6.6 (H) 05/26/2016   MPG 143 05/26/2016   Lab Results  Component Value Date   PROLACTIN 20.8 (H) 05/26/2016   Lab Results  Component Value Date   CHOL 170 05/26/2016   TRIG 159 (H) 05/26/2016   HDL 28 (L) 05/26/2016   CHOLHDL 6.1 05/26/2016   VLDL 32 05/26/2016   LDLCALC 110 (H) 05/26/2016    Physical Findings: AIMS: Facial and Oral Movements Muscles of Facial Expression: None, normal Lips and Perioral Area: None, normal Jaw: None, normal Tongue: None, normal,Extremity Movements Upper (arms, wrists, hands, fingers): None, normal Lower (legs, knees, ankles, toes): None, normal, Trunk Movements Neck, shoulders, hips: None, normal, Overall Severity Severity of abnormal movements (highest  score from questions above): None, normal Incapacitation due to abnormal movements: None, normal Patient's awareness of abnormal movements (rate only patient's report): No Awareness, Dental Status Current problems with teeth and/or dentures?: No Does patient usually wear dentures?: No  CIWA:  CIWA-Ar Total: 1 COWS:  COWS Total Score: 1  Musculoskeletal: Strength & Muscle Tone: within normal limits Gait & Station: normal Patient leans: N/A  Psychiatric Specialty Exam: Physical Exam  Nursing note and vitals reviewed.   Review of Systems  Psychiatric/Behavioral: The patient is nervous/anxious.   All other systems reviewed and are negative.   Blood pressure (!) 147/84, pulse 66, temperature 97.7 F (36.5 C), temperature source Oral, resp. rate 18, height 5' 11.25" (1.81 m), weight 105.3 kg (232 lb 4 oz), SpO2 97 %.Body mass index is 32.17 kg/m.  General Appearance: Casual  Eye Contact:  Fair  Speech:  Normal Rate  Volume:  Normal  Mood:  Anxious improved  Affect:  Appropriate  Thought Process:  Goal Directed and Descriptions of Associations: Circumstantial  Orientation:  Full (Time, Place, and Person)  Thought Content:  Logical  Suicidal Thoughts:  No  Homicidal Thoughts:  No  Memory:  Immediate;   Fair Recent;   Fair Remote;   Fair  Judgement:  Fair  Insight:  Shallow  Psychomotor Activity:  Normal  Concentration:  Concentration: Fair and Attention Span: Fair  Recall:  [employer] of Knowledge:  Fair  Language:  Fair  Akathisia:  No  Handed:  Right  AIMS (if indicated):     Assets:  Desire for Improvement Financial Resources/Insurance Housing Intimacy Leisure Time Physical Health Social Support Talents/Skills Transportation  ADL's:  Intact  Cognition:  WNL  Sleep:  Number of  Hours: 6.75     Treatment Plan Summary: Tyland L Hurleyis a 50 y.o.caucasian male who is married , lives with his wife - Aram BeechamCynthia of 3120 + years , 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  Patient today seen as alert, oriented , continues to report sleep issues . Discussed changing his trazodone dose tonight.  Also discussed that at this time his diagnosis is likely Delirium due to multiple factors - however will need to keep Bipolar disorder in mind and will need to follow up with an out patient provider.   Daily contact with patient to assess and evaluate symptoms and progress in treatment and Medication management Will continue reduced dose of Cymbalta 30 mg po daily for affective sx- since unknown if this is a true manic episode . However per wife patient has been on it since 15 years or so without any adverse effects. Will continue Haldol 2.5 mg po daily and 5 mg po qhs for psychosis, mood lability. Will continue Cogentin 0.5 mg po qhs for EPS. Will increase Trazodone to 100 mg po qhs prn for sleep. Will make available PRN medications as per agitation protocol. Will continue Bactrim 800 mg po q12 h for UTI - UA - leukocytes and wbc. Will encourage PO fluids , pending Uclx. Discontinued Hydrodiuril 12.5 mg po daily for HTN- patient has an elevated creatinine level . Added Metoprolol 25 mg po bid , Lisinopril 5 mg po daily for BP-Will repeat CMP on Saturday prior to likely discharge  . Please see FM Consult note. Will continue Metformin 500 mg po daily for DM. Will continue CBGs , SSI as per unit protocol. Will not restart Cinsulin - pt was on it for hyperglycemia .  Will continue Vitamin D 1000 unit daily po for vitamin D  Deficiency. Will continue to monitor vitals ,medication compliance and treatment side effects while patient is here.  Will monitor for medical issues as well as call consult as needed.  Reviewed labs CBC - wbc - 15.2, neutrophils - 81.4, creatinine slightly  elevated at 1.4, CT head w/o contrast - negative for any acute pathology  ,repeat CBC- wbc - wnl , CMP- creatinine - still at 1.46 , AST - 51 , TSH - wnl  , lipid panel- wnl , hba1c- 6.6 , pl- 20.8 , EKG for qtc- wnl . CSW will continue working on disposition.  Patient to participate in therapeutic milieu  Amariah Kierstead, MD 05/28/2016, 2:28 PM

## 2016-05-29 DIAGNOSIS — Z833 Family history of diabetes mellitus: Secondary | ICD-10-CM

## 2016-05-29 DIAGNOSIS — Z818 Family history of other mental and behavioral disorders: Secondary | ICD-10-CM

## 2016-05-29 DIAGNOSIS — F05 Delirium due to known physiological condition: Principal | ICD-10-CM

## 2016-05-29 LAB — COMPREHENSIVE METABOLIC PANEL
ALT: 44 U/L (ref 17–63)
AST: 33 U/L (ref 15–41)
Albumin: 4.1 g/dL (ref 3.5–5.0)
Alkaline Phosphatase: 59 U/L (ref 38–126)
Anion gap: 6 (ref 5–15)
BUN: 17 mg/dL (ref 6–20)
CO2: 25 mmol/L (ref 22–32)
Calcium: 9.8 mg/dL (ref 8.9–10.3)
Chloride: 105 mmol/L (ref 101–111)
Creatinine, Ser: 1.44 mg/dL — ABNORMAL HIGH (ref 0.61–1.24)
GFR calc Af Amer: >60 mL/min (ref >60)
GFR calc non Af Amer: 55 mL/min — ABNORMAL LOW (ref >60)
Glucose, Bld: 120 mg/dL — ABNORMAL HIGH (ref 65–99)
Potassium: 4.4 mmol/L (ref 3.5–5.1)
Sodium: 136 mmol/L (ref 135–145)
Total Bilirubin: 0.7 mg/dL (ref 0.3–1.2)
Total Protein: 7.3 g/dL (ref 6.5–8.1)

## 2016-05-29 LAB — GLUCOSE, CAPILLARY
Glucose-Capillary: 118 mg/dL — ABNORMAL HIGH (ref 65–99)
Glucose-Capillary: 109 mg/dL — ABNORMAL HIGH (ref 65–99)

## 2016-05-29 MED ORDER — TRAZODONE HCL 100 MG PO TABS: 100.0000 mg | ORAL_TABLET | Freq: Every day | ORAL | 0 refills | Status: AC

## 2016-05-29 MED ORDER — VITAMIN D3 25 MCG (1000 UNIT) PO TABS: 1000.0000 [IU] | ORAL_TABLET | Freq: Every day | ORAL | 0 refills | Status: AC

## 2016-05-29 MED ORDER — HALOPERIDOL 0.5 MG PO TABS: 2.5000 mg | ORAL_TABLET | Freq: Every day | ORAL | 0 refills | Status: AC

## 2016-05-29 MED ORDER — LISINOPRIL 5 MG PO TABS: 5.0000 mg | ORAL_TABLET | Freq: Every day | ORAL | 0 refills | Status: AC

## 2016-05-29 MED ORDER — FAMOTIDINE 20 MG PO TABS: 20.0000 mg | ORAL_TABLET | Freq: Two times a day (BID) | ORAL | 0 refills | Status: AC

## 2016-05-29 MED ORDER — METOPROLOL TARTRATE 25 MG PO TABS: 25.0000 mg | ORAL_TABLET | Freq: Two times a day (BID) | ORAL | 0 refills | Status: AC

## 2016-05-29 MED ORDER — DULOXETINE HCL 30 MG PO CPEP: 30.0000 mg | ORAL_CAPSULE | Freq: Every day | ORAL | 0 refills | Status: AC

## 2016-05-29 MED ORDER — BENZTROPINE MESYLATE 0.5 MG PO TABS: 0.5000 mg | ORAL_TABLET | Freq: Every day | ORAL | 0 refills | Status: AC

## 2016-05-29 MED ORDER — SULFAMETHOXAZOLE-TRIMETHOPRIM 800-160 MG PO TABS: 1.0000 | ORAL_TABLET | Freq: Two times a day (BID) | ORAL | 0 refills | Status: DC

## 2016-05-29 MED ORDER — METFORMIN HCL ER 500 MG PO TB24: 500.0000 mg | ORAL_TABLET | Freq: Every day | ORAL | 0 refills | Status: AC

## 2016-05-29 MED ORDER — HALOPERIDOL 5 MG PO TABS: 5.0000 mg | ORAL_TABLET | Freq: Every day | ORAL | 0 refills | Status: AC
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
## 2016-05-29 NOTE — Progress Notes (Signed)
D: Pt at the time of assessment denied depression, anxiety, pain, SI, HI or AVH; he states, "For some reason, I feel wonderful today." Pt was out of room more today; however, Pt remained withdrawn to self even whiles the dayroom. Pt remained calm and cooperative. A: Medications offered as prescribed.  Support, encouragement, and safe environment provided.  15-minute safety checks continue. R: Pt was med compliant.  Pt attended wrap-up group. Safety checks continue.

## 2016-05-29 NOTE — [facility] Group Notes (Signed)
[facility] Group Notes:  (Clinical Social Work)  05/29/2016  11:15-12:00PM  Summary of Progress/Problems:   Today's process group involved patients discussing their feelings related to being hospitalized, as well as how they can use their present feelings to create a goal for the day. The patient expressed his primary feeling about being hospitalized is "safe" although at first he was scared.  He said the way he will stay out of the hospital, as he is discharging today, is to "control what I can" such as taking his medications.    Type of Therapy:  Group Therapy - Process  Participation Level:  Active  Participation Quality:  Attentive  Affect:  Appropriate  Cognitive:  Appropriate  Insight:  Developing/Improving  Engagement in Therapy:  Developing/Improving  Modes of Intervention:  Exploration, Discussion  Ethan MantleMareida Grossman-Orr, LCSW 05/29/2016, 12:29 PM
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
## 2016-05-29 NOTE — Progress Notes (Signed)
Patient discharged to lobby. Patient was stable and appreciative at that time. All papers and prescriptions were given and valuables returned. Verbal understanding expressed. Denies SI/HI and A/VH. Patient given opportunity to express concerns and ask questions.  

## 2019-05-16 ENCOUNTER — Encounter (HOSPITAL_COMMUNITY): Payer: Self-pay | Admitting: Emergency Medicine

## 2019-05-16 ENCOUNTER — Other Ambulatory Visit: Payer: Self-pay

## 2019-05-16 ENCOUNTER — Emergency Department (HOSPITAL_COMMUNITY)
Admission: EM | Admit: 2019-05-16 | Discharge: 2019-05-17 | Disposition: A | Discharge status: Home / Self Care | Payer: Federal, State, Local not specified - PPO | Attending: Emergency Medicine | Admitting: Emergency Medicine

## 2019-05-16 DIAGNOSIS — R739 Hyperglycemia, unspecified: Secondary | ICD-10-CM

## 2019-05-16 DIAGNOSIS — E1165 Type 2 diabetes mellitus with hyperglycemia: Secondary | ICD-10-CM | POA: Diagnosis present

## 2019-05-16 DIAGNOSIS — I10 Essential (primary) hypertension: Secondary | ICD-10-CM | POA: Diagnosis not present

## 2019-05-16 DIAGNOSIS — Z7984 Long term (current) use of oral hypoglycemic drugs: Secondary | ICD-10-CM | POA: Diagnosis not present

## 2019-05-16 HISTORY — DX: Type 2 diabetes mellitus without complications: E11.9

## 2019-05-16 HISTORY — DX: Essential (primary) hypertension: I10

## 2019-05-16 LAB — URINALYSIS, ROUTINE W REFLEX MICROSCOPIC
Bilirubin Urine: NEGATIVE
Glucose, UA: >=500 mg/dL — AB
Hgb urine dipstick: NEGATIVE
Ketones, ur: 5 mg/dL — AB
Leukocytes,Ua: NEGATIVE
Nitrite: NEGATIVE
Protein, ur: NEGATIVE mg/dL
Specific Gravity, Urine: 1.033 — ABNORMAL HIGH (ref 1.005–1.030)
pH: 5 (ref 5.0–8.0)

## 2019-05-16 LAB — CBC WITH DIFFERENTIAL/PLATELET
Abs Immature Granulocytes: 0.02 10*3/uL (ref 0.00–0.07)
Basophils Absolute: 0.1 10*3/uL (ref 0.0–0.1)
Basophils Relative: 1 %
Eosinophils Absolute: 0.2 10*3/uL (ref 0.0–0.5)
Eosinophils Relative: 2 %
HCT: 45.7 % (ref 39.0–52.0)
Hemoglobin: 16.2 g/dL (ref 13.0–17.0)
Immature Granulocytes: 0 %
Lymphocytes Relative: 25 %
Lymphs Abs: 2.4 10*3/uL (ref 0.7–4.0)
MCH: 31.5 pg (ref 26.0–34.0)
MCHC: 35.4 g/dL (ref 30.0–36.0)
MCV: 88.7 fL (ref 80.0–100.0)
Monocytes Absolute: 0.8 10*3/uL (ref 0.1–1.0)
Monocytes Relative: 8 %
Neutro Abs: 6 10*3/uL (ref 1.7–7.7)
Neutrophils Relative %: 64 %
Platelets: 316 10*3/uL (ref 150–400)
RBC: 5.15 MIL/uL (ref 4.22–5.81)
RDW: 12.2 % (ref 11.5–15.5)
WBC: 9.5 10*3/uL (ref 4.0–10.5)
nRBC: 0 % (ref 0.0–0.2)

## 2019-05-16 LAB — CBG MONITORING, ED: Glucose-Capillary: 438 mg/dL — ABNORMAL HIGH (ref 70–99)

## 2019-05-16 NOTE — ED Triage Notes (Signed)
Patient reports elevated blood sugar at home this evening 465, denies pain or discomfort , no emesis or fever .

## 2019-05-17 LAB — COMPREHENSIVE METABOLIC PANEL
ALT: 48 U/L — ABNORMAL HIGH (ref 0–44)
AST: 43 U/L — ABNORMAL HIGH (ref 15–41)
Albumin: 4.1 g/dL (ref 3.5–5.0)
Alkaline Phosphatase: 93 U/L (ref 38–126)
Anion gap: 8 (ref 5–15)
BUN: 19 mg/dL (ref 6–20)
CO2: 26 mmol/L (ref 22–32)
Calcium: 10 mg/dL (ref 8.9–10.3)
Chloride: 96 mmol/L — ABNORMAL LOW (ref 98–111)
Creatinine, Ser: 1.12 mg/dL (ref 0.61–1.24)
GFR calc Af Amer: >60 mL/min (ref >60)
GFR calc non Af Amer: >60 mL/min (ref >60)
Glucose, Bld: 446 mg/dL — ABNORMAL HIGH (ref 70–99)
Potassium: 4.2 mmol/L (ref 3.5–5.1)
Sodium: 130 mmol/L — ABNORMAL LOW (ref 135–145)
Total Bilirubin: 0.8 mg/dL (ref 0.3–1.2)
Total Protein: 6.9 g/dL (ref 6.5–8.1)

## 2019-05-17 LAB — CBG MONITORING, ED
Glucose-Capillary: 298 mg/dL — ABNORMAL HIGH (ref 70–99)
Glucose-Capillary: 324 mg/dL — ABNORMAL HIGH (ref 70–99)

## 2019-05-17 MED ORDER — SODIUM CHLORIDE 0.9 % IV BOLUS
2000.0000 mL | Freq: Once | INTRAVENOUS | Status: AC
  Administered 2019-05-17: 2000 mL via INTRAVENOUS

## 2019-05-17 MED ORDER — INSULIN ASPART 100 UNIT/ML ~~LOC~~ SOLN
10.0000 [IU] | Freq: Once | SUBCUTANEOUS | Status: AC
  Administered 2019-05-17: 10 [IU] via INTRAVENOUS

## 2019-05-17 MED ORDER — PIOGLITAZONE HCL 15 MG PO TABS: 15.0000 mg | ORAL_TABLET | Freq: Every day | ORAL | 0 refills | Status: AC

## 2019-05-17 NOTE — ED Notes (Signed)
Pt verbalized understanding of d/c instructions, scripts and s/s requiring return to ed 

## 2019-05-17 NOTE — ED Provider Notes (Signed)
[facility] Provider Note   CSN: [id number] Arrival date & time: 05/16/19  2251     History   Chief Complaint Chief Complaint  Patient presents with  . Hyperglycemia    HPI Decklin L Nesmith is a 53 y.o. male with a history of diabetes mellitus who presents to the emergency department with a chief complaint of hyperglycemia.  The patient's wife reports that she checked his blood sugar today at 1300 and he had a reading of 392.  She rechecked his blood sugar at 2130 and received a reading of high.  She rechecked it again at 2145 and received a reading of 468.  She reports that she called and spoke to the on-call provider at the patient's primary care practice and was advised to come to the ER for further work-up and evaluation.  He only ate once today at approximately 1500 when he had pizza and regular Dr. Malachi Bonds.  She reports that he has now endorsing dizziness, but states that he has been having this over the last few weeks that is also been accompanied by polyuria and polydipsia.  The patient's wife reports that they also just returned from vacation yesterday.  She reports that he did have quite a bit of pasta, burgers, but also frequently ate salads during their trip.  The patient has previously taken Actos 30 mg, metformin, and Amaryl for diabetes, but they were discontinued due to side effects because he felt dizzy.  He has previously been seen by Dr. Posey Pronto with endocrinology, but a referral by his primary care team has been placed to Dr. Su Hoff to establish care.  He denies fever, chills, chest pain, shortness of breath, confusion, vomiting, nausea, abdominal pain, weakness, numbness, dysuria, or hematuria.  The patient's wife reports that they do not check his blood sugar regularly and today was the first time they have checked it in an unknown amount of time.     The history is provided by the patient. No language interpreter was used.     Past Medical History:  Diagnosis Date  . Diabetes mellitus without complication ([facility])   . Hypertension     Patient Active Problem List   Diagnosis Date Noted  . Delirium due to multiple etiologies 05/28/2016  . GAD (generalized anxiety disorder) 05/25/2016  . Vitamin D deficiency 05/25/2016  . Hypogonadism male 05/25/2016  . Diabetes mellitus ([place]) 05/25/2016  . Hyperlipidemia 05/25/2016  . Essential hypertension 05/25/2016  . UTI (urinary tract infection) 05/25/2016    History reviewed. No pertinent surgical history.      Home Medications    Prior to Admission medications   Medication Sig Start Date End Date Taking? Authorizing Provider  albuterol (PROAIR HFA) 108 (90 Base) MCG/ACT inhaler Inhale 2 puffs into the lungs every 4 (four) hours as needed for wheezing or shortness of breath.  09/15/15   [provider]  benztropine (COGENTIN) 0.5 MG tablet Take 1 tablet (0.5 mg total) by mouth at bedtime. 05/29/16   Derrill Center, NP  cholecalciferol (VITAMIN D) 1000 units tablet Take 1 tablet (1,000 Units total) by mouth daily. 05/30/16   Derrill Center, NP  DULoxetine (CYMBALTA) 30 MG capsule Take 1 capsule (30 mg total) by mouth daily. 05/30/16   Derrill Center, NP  famotidine (PEPCID) 20 MG tablet Take 1 tablet (20 mg total) by mouth 2 (two) times daily. 05/29/16   Derrill Center, NP  haloperidol (HALDOL) 0.5 MG tablet Take 5 tablets (  2.5 mg total) by mouth daily. 05/30/16   Oneta Rack, NP  haloperidol (HALDOL) 5 MG tablet Take 1 tablet (5 mg total) by mouth at bedtime. 05/29/16   Oneta Rack, NP  lisinopril (PRINIVIL,ZESTRIL) 5 MG tablet Take 1 tablet (5 mg total) by mouth daily. 05/30/16   Oneta Rack, NP  metFORMIN (GLUCOPHAGE-XR) 500 MG 24 hr tablet Take 1 tablet (500 mg total) by mouth daily with breakfast. 05/30/16   Oneta Rack, NP  metoprolol tartrate (LOPRESSOR) 25 MG tablet Take 1 tablet (25 mg total) by mouth 2 (two) times daily. 05/29/16   Oneta Rack, NP  pioglitazone (ACTOS) 15 MG tablet Take 1 tablet (15 mg total) by mouth daily for 14 days. 05/17/19 05/31/19  Keontay Vora A, PA-C  sulfamethoxazole-trimethoprim (BACTRIM DS,SEPTRA DS) 800-160 MG tablet Take 1 tablet by mouth every 12 (twelve) hours. 05/29/16   Oneta Rack, NP  testosterone cypionate (DEPO-TESTOSTERONE) 200 MG/ML injection Inject 200 mg into the muscle every 14 (fourteen) days.     [provider]  traZODone (DESYREL) 100 MG tablet Take 1 tablet (100 mg total) by mouth at bedtime. 05/29/16   Oneta Rack, NP    Family History Family History  Problem Relation Age of Onset  . Anxiety disorder Mother   . Diabetes Father   . Depression Father   . Anxiety disorder Other   . Mental illness Paternal Grandmother     Social History Social History   Tobacco Use  . Smoking status: Never Smoker  . Smokeless tobacco: Never Used  Substance Use Topics  . Alcohol use: Never    Frequency: Never  . Drug use: Never     Allergies   Penicillins   Review of Systems Review of Systems  Constitutional: Negative for appetite change and fever.  HENT: Negative for congestion, sinus pressure, sinus pain, sore throat and voice change.   Respiratory: Negative for shortness of breath and wheezing.   Cardiovascular: Negative for chest pain, palpitations and leg swelling.  Gastrointestinal: Negative for abdominal pain, diarrhea, nausea and vomiting.  Endocrine: Positive for polyphagia and polyuria.  Genitourinary: Negative for dysuria.  Musculoskeletal: Negative for back pain.  Skin: Negative for rash.  Allergic/Immunologic: Negative for immunocompromised state.  Neurological: Positive for dizziness. Negative for seizures, syncope, light-headedness, numbness and headaches.  Psychiatric/Behavioral: Negative for confusion.     Physical Exam Updated Vital Signs BP 127/82 (BP Location: Right Arm)   Pulse (!) 58   Temp 98.4 F (36.9 C) (Oral)   Resp 18    SpO2 94%   Physical Exam Vitals signs and nursing note reviewed.  Constitutional:      Appearance: He is well-developed.  HENT:     Head: Normocephalic.  Eyes:     Conjunctiva/sclera: Conjunctivae normal.  Neck:     Musculoskeletal: Neck supple.  Cardiovascular:     Rate and Rhythm: Normal rate and regular rhythm.     Pulses: Normal pulses.     Heart sounds: Normal heart sounds. No murmur. No friction rub. No gallop.   Pulmonary:     Effort: Pulmonary effort is normal. No respiratory distress.     Breath sounds: No stridor. No wheezing, rhonchi or rales.  Chest:     Chest wall: No tenderness.  Abdominal:     General: There is no distension.     Palpations: Abdomen is soft. There is no mass.     Tenderness: There is no abdominal tenderness. There  is no right CVA tenderness, left CVA tenderness, guarding or rebound.     Hernia: No hernia is present.  Skin:    General: Skin is warm and dry.  Neurological:     Mental Status: He is alert.     Comments: Alert and oriented x3.  Patient has been seen ambulating through the ER multiple times with his IV pole with steady gait.  Psychiatric:        Behavior: Behavior normal.      ED Treatments / Results  Labs (all labs ordered are listed, but only abnormal results are displayed) Labs Reviewed  COMPREHENSIVE METABOLIC PANEL - Abnormal; Notable for the following components:      Result Value   Sodium 130 (*)    Chloride 96 (*)    Glucose, Bld 446 (*)    AST 43 (*)    ALT 48 (*)    All other components within normal limits  URINALYSIS, ROUTINE W REFLEX MICROSCOPIC - Abnormal; Notable for the following components:   Color, Urine STRAW (*)    Specific Gravity, Urine 1.033 (*)    Glucose, UA >=500 (*)    Ketones, ur 5 (*)    Bacteria, UA RARE (*)    All other components within normal limits  CBG MONITORING, ED - Abnormal; Notable for the following components:   Glucose-Capillary 438 (*)    All other components within normal  limits  CBG MONITORING, ED - Abnormal; Notable for the following components:   Glucose-Capillary 324 (*)    All other components within normal limits  CBG MONITORING, ED - Abnormal; Notable for the following components:   Glucose-Capillary 298 (*)    All other components within normal limits  CBC WITH DIFFERENTIAL/PLATELET    EKG None  Radiology No results found.  Procedures Procedures (including critical care time)  Medications Ordered in ED Medications  sodium chloride 0.9 % bolus 2,000 mL (0 mLs Intravenous Stopped 05/17/19 0334)  insulin aspart (novoLOG) injection 10 Units (10 Units Intravenous Given 05/17/19 0250)     Initial Impression / Assessment and Plan / ED Course  I have reviewed the triage vital signs and the nursing notes.  Pertinent labs & imaging results that were available during my care of the patient were reviewed by me and considered in my medical decision making (see chart for details).        53 year old male with a history of diabetes mellitus presenting with hyperglycemia.  He has been having polyuria and polydipsia as well as dizziness for several weeks.  His wife checked his blood sugar today for the first time and an unknown amount of time and was concerned given high readings- 392->HIGH->468 and was advised to come to the ER by the patient's primary care team for further evaluation.  Glucose is 446 in the ER with normal anion gap and bicarb.  Urinalysis is not concerning for infection.  Patient's physical exam is unremarkable.  Although the patient is endorsing dizziness at this time, he has been noted to be ambulating through the department with his IV pole while he has been receiving fluids with a steady gait.  Transaminases are minimally elevated, but he has no right upper quadrant tenderness.  Doubt cholecystitis, pancreatitis, HHS, DKA.  The patient received 2 L of fluids and on recheck glucose was 324.  Will give 10 units of NovoLog subq and  reassess.  Spoke with pharmacy regarding initiation of new antihyperglycemic as the patient has been unable to tolerate  multiple medications in the past secondary to dizziness.  Pharmacy also recommended Actos 15 mg as he has tried most other oral anti-hyperglycemics.  We will provide the patient with a short course of this medication until he can be further evaluated in the outpatient setting.  The patient is wife are agreeable with plan at this time.  After receiving insulin, patient's blood sugar is 298.  He is hemodynamically stable and in no acute distress.  ER return precautions given.  He is safe for discharge to home with outpatient follow-up for glucose recheck and medication management.  Final Clinical Impressions(s) / ED Diagnoses   Final diagnoses:  Hyperglycemia    ED Discharge Orders         Ordered    pioglitazone (ACTOS) 15 MG tablet  Daily     05/17/19 0257           Frederik PearMcDonald, Rein Popov A, PA-C 05/17/19 0539    Dione BoozeGlick, David, MD 05/17/19 [phone]

## 2019-05-17 NOTE — Discharge Instructions (Addendum)
Thank you for allowing me to care for you today in the Emergency Department.   Your primary care provider has given you a referral to Dr. Su Hoff with endocrinology.  Call tomorrow to schedule follow-up appointment with primary care for recheck as it may take some time to get into endocrinology.  Take 1 tablet of Actos daily to help control your blood sugar.  Return to the emergency department if you develop confusion, persistently high blood sugar, vomiting, abdominal pain, significantly worsening changes in your vision, or other new, concerning symptoms.

## 2020-10-28 ENCOUNTER — Other Ambulatory Visit: Payer: Self-pay

## 2020-10-28 ENCOUNTER — Ambulatory Visit
Admission: EM | Admit: 2020-10-28 | Discharge: 2020-10-28 | Disposition: A | Discharge status: Home / Self Care | Payer: Federal, State, Local not specified - PPO

## 2020-10-28 DIAGNOSIS — R1084 Generalized abdominal pain: Secondary | ICD-10-CM | POA: Diagnosis not present

## 2020-10-28 NOTE — ED Provider Notes (Signed)
[facility]    CSN: [id number] Arrival date & time: 10/28/20  1852      History   Chief Complaint Chief Complaint  Patient presents with  . Abdominal Pain    HPI [facility] is a 55 y.o. male.   Reports periumbilical pain intermittently for the last 3 days. Reports decreased appetite due to pain. At its worst, reports pain as sharp and 8/10. Has medical history that includes HTN and diabetes that is controlled well with medications. Denies abdominal bulge or history of hernia. Denies recent injury. Has taken OTC antacids with little relief. Denies this type of pain in the past. Denies nausea, vomiting, diarrhea, rash, fever, other symptoms. Denies sick contacts.  ROS per HPI  The history is provided by the patient.    Past Medical History:  Diagnosis Date  . Diabetes mellitus without complication ([facility])   . Hypertension     Patient Active Problem List   Diagnosis Date Noted  . Delirium due to multiple etiologies 05/28/2016  . GAD (generalized anxiety disorder) 05/25/2016  . Vitamin D deficiency 05/25/2016  . Hypogonadism male 05/25/2016  . Diabetes mellitus ([facility]) 05/25/2016  . Hyperlipidemia 05/25/2016  . Essential hypertension 05/25/2016  . UTI (urinary tract infection) 05/25/2016    No past surgical history on file.     Home Medications    Prior to Admission medications   Medication Sig Start Date End Date Taking? Authorizing Provider  albuterol (VENTOLIN HFA) 108 (90 Base) MCG/ACT inhaler Inhale 2 puffs into the lungs every 4 (four) hours as needed for wheezing or shortness of breath.  09/15/15   [provider]  benztropine (COGENTIN) 0.5 MG tablet Take 1 tablet (0.5 mg total) by mouth at bedtime. 05/29/16   Oneta Rack, NP  cholecalciferol (VITAMIN D) 1000 units tablet Take 1 tablet (1,000 Units total) by mouth daily. 05/30/16   Oneta Rack, NP  DULoxetine (CYMBALTA) 30 MG capsule Take 1 capsule (30 mg total) by mouth daily.  05/30/16   Oneta Rack, NP  famotidine (PEPCID) 20 MG tablet Take 1 tablet (20 mg total) by mouth 2 (two) times daily. 05/29/16   Oneta Rack, NP  haloperidol (HALDOL) 0.5 MG tablet Take 5 tablets (2.5 mg total) by mouth daily. 05/30/16   Oneta Rack, NP  haloperidol (HALDOL) 5 MG tablet Take 1 tablet (5 mg total) by mouth at bedtime. 05/29/16   Oneta Rack, NP  HYDROcodone-acetaminophen (NORCO/VICODIN) 5-325 MG tablet Take 2 tablets by mouth every 4 (four) hours as needed. 10/29/20   Little, Ambrose Finland, MD  JANUVIA 100 MG tablet Take 100 mg by mouth daily. 09/28/20   [provider]  lisinopril (PRINIVIL,ZESTRIL) 5 MG tablet Take 1 tablet (5 mg total) by mouth daily. 05/30/16   Oneta Rack, NP  metFORMIN (GLUCOPHAGE-XR) 500 MG 24 hr tablet Take 1 tablet (500 mg total) by mouth daily with breakfast. 05/30/16   Oneta Rack, NP  metoprolol tartrate (LOPRESSOR) 25 MG tablet Take 1 tablet (25 mg total) by mouth 2 (two) times daily. 05/29/16   Oneta Rack, NP  ondansetron (ZOFRAN ODT) 4 MG disintegrating tablet Take 1 tablet (4 mg total) by mouth every 8 (eight) hours as needed for nausea or vomiting. 10/29/20   Little, Ambrose Finland, MD  pioglitazone (ACTOS) 15 MG tablet Take 1 tablet (15 mg total) by mouth daily for 14 days. 05/17/19 05/31/19  McDonald, Mia A, PA-C  sulfamethoxazole-trimethoprim (BACTRIM DS,SEPTRA DS)  800-160 MG tablet Take 1 tablet by mouth every 12 (twelve) hours. 05/29/16   Oneta Rack, NP  tamsulosin (FLOMAX) 0.4 MG CAPS capsule Take 1 capsule (0.4 mg total) by mouth daily. 10/29/20   Little, Ambrose Finland, MD  testosterone cypionate (DEPOTESTOSTERONE CYPIONATE) 200 MG/ML injection Inject 200 mg into the muscle every 14 (fourteen) days.     [provider]  traZODone (DESYREL) 100 MG tablet Take 1 tablet (100 mg total) by mouth at bedtime. 05/29/16   Oneta Rack, NP    Family History Family History  Problem Relation Age of Onset  . Anxiety  disorder Mother   . Diabetes Father   . Depression Father   . Anxiety disorder Other   . Mental illness Paternal Grandmother     Social History Social History   Tobacco Use  . Smoking status: Never Smoker  . Smokeless tobacco: Never Used  Substance Use Topics  . Alcohol use: Never  . Drug use: Never     Allergies   Penicillins   Review of Systems Review of Systems   Physical Exam Triage Vital Signs ED Triage Vitals  Enc Vitals Group     BP 10/28/20 1913 137/81     Pulse Rate 10/28/20 1913 68     Resp 10/28/20 1913 20     Temp 10/28/20 1913 98.1 F (36.7 C)     Temp Source 10/28/20 1913 Oral     SpO2 10/28/20 1913 94 %     Weight --      Height --      Head Circumference --      Peak Flow --      Pain Score 10/28/20 1922 3     Pain Loc --      Pain Edu? --      Excl. in GC? --    No data found.  Updated Vital Signs BP 137/81 (BP Location: Left Arm)   Pulse 68   Temp 98.1 F (36.7 C) (Oral)   Resp 20   SpO2 94%        Physical Exam Vitals and nursing note reviewed.  Constitutional:      General: He is not in acute distress.    Appearance: He is well-developed. He is not ill-appearing.  HENT:     Head: Normocephalic and atraumatic.  Eyes:     Conjunctiva/sclera: Conjunctivae normal.  Cardiovascular:     Rate and Rhythm: Normal rate and regular rhythm.     Heart sounds: Normal heart sounds. No murmur heard.   Pulmonary:     Effort: Pulmonary effort is normal. No respiratory distress.     Breath sounds: Normal breath sounds. No stridor. No wheezing, rhonchi or rales.  Chest:     Chest wall: No tenderness.  Abdominal:     General: Bowel sounds are normal. There is no distension or abdominal bruit. There are no signs of injury.     Palpations: Abdomen is soft. There is no shifting dullness, fluid wave, hepatomegaly, splenomegaly, mass or pulsatile mass.     Tenderness: There is abdominal tenderness in the periumbilical area. There is guarding.  There is no right CVA tenderness, left CVA tenderness or rebound. Negative signs include Murphy's sign, Rovsing's sign, McBurney's sign, psoas sign and obturator sign.     Hernia: No hernia is present.  Musculoskeletal:     Cervical back: Neck supple.  Skin:    General: Skin is warm and dry.     Capillary  Refill: Capillary refill takes less than 2 seconds.  Neurological:     General: No focal deficit present.     Mental Status: He is alert.  Psychiatric:        Mood and Affect: Mood normal.        Behavior: Behavior normal.      UC Treatments / Results  Labs (all labs ordered are listed, but only abnormal results are displayed) Labs Reviewed  CBC WITH DIFFERENTIAL/PLATELET - Abnormal; Notable for the following components:      Result Value   Hematocrit 51.3 (*)    Monocytes Absolute 1.0 (*)    All other components within normal limits   Narrative:    Performed at:  [address] [place], [place]  [id number] Lab Director: Jolene Schimke MD, Phone:  [phone]  COMPREHENSIVE METABOLIC PANEL - Abnormal; Notable for the following components:   Glucose 149 (*)    Calcium 10.3 (*)    All other components within normal limits   Narrative:    Performed at:  [address] [employer] [address], [place], [place]  [id number] Lab Director: Jolene Schimke MD, Phone:  [phone]  LIPASE   Narrative:    Performed at:  [address] [facility] [address] [place], [place]  [id number] Lab Director: Jolene Schimke MD, Phone:  [phone]    EKG   Radiology   Procedures Procedures (including critical care time)  Medications Ordered in UC Medications - No data to display  Initial Impression / Assessment and Plan / UC Course  I have reviewed the triage vital signs and the nursing notes.  Pertinent labs & imaging results that were available during my care of the patient were reviewed by me and considered in my medical decision making (see chart for  details).     Abdominal Pain  Given severity of pain and tenderness around the umbilicus, discussed with patient that he would be best served in the ER for further evaluation and treatment Declines going to the ER at this time CBC, CMP, lipase drawn Will be in touch with abnormal results that require further treatment Cannot rule out obstruction, appendicitis, acute abdomen in this office Pt verbalizes that if pain gets worse again, he will go to the ER  Stable at discharge  Final Clinical Impressions(s) / UC Diagnoses   Final diagnoses:  Generalized abdominal pain     Discharge Instructions     We are drawing some labs tonight  We will call you with any abnormal results that need further treatment  If your pain gets worse, you develop a fever, nausea, vomiting, other concerning symptoms, follow up in the ER    ED Prescriptions    None     PDMP not reviewed this encounter.   Moshe Cipro, NP 10/30/20 [phone]

## 2020-10-28 NOTE — Discharge Instructions (Signed)
We are drawing some labs tonight  We will call you with any abnormal results that need further treatment  If your pain gets worse, you develop a fever, nausea, vomiting, other concerning symptoms, follow up in the ER

## 2020-10-28 NOTE — ED Triage Notes (Signed)
Pt presents with c/o abdominal pain at the area of naval, pain has been intermittent for past 3 days. Worst is 8/10 appetite is poor

## 2020-10-29 ENCOUNTER — Emergency Department (HOSPITAL_BASED_OUTPATIENT_CLINIC_OR_DEPARTMENT_OTHER)
Admission: EM | Admit: 2020-10-29 | Discharge: 2020-10-29 | Disposition: A | Discharge status: Home / Self Care | Payer: Federal, State, Local not specified - PPO | Attending: Emergency Medicine | Admitting: Emergency Medicine

## 2020-10-29 ENCOUNTER — Emergency Department (HOSPITAL_BASED_OUTPATIENT_CLINIC_OR_DEPARTMENT_OTHER): Payer: Federal, State, Local not specified - PPO

## 2020-10-29 ENCOUNTER — Other Ambulatory Visit: Payer: Self-pay

## 2020-10-29 ENCOUNTER — Encounter (HOSPITAL_BASED_OUTPATIENT_CLINIC_OR_DEPARTMENT_OTHER): Payer: Self-pay | Admitting: Emergency Medicine

## 2020-10-29 DIAGNOSIS — I10 Essential (primary) hypertension: Secondary | ICD-10-CM | POA: Insufficient documentation

## 2020-10-29 DIAGNOSIS — N201 Calculus of ureter: Secondary | ICD-10-CM | POA: Insufficient documentation

## 2020-10-29 DIAGNOSIS — R1032 Left lower quadrant pain: Secondary | ICD-10-CM

## 2020-10-29 DIAGNOSIS — E119 Type 2 diabetes mellitus without complications: Secondary | ICD-10-CM | POA: Diagnosis not present

## 2020-10-29 DIAGNOSIS — Z7984 Long term (current) use of oral hypoglycemic drugs: Secondary | ICD-10-CM | POA: Insufficient documentation

## 2020-10-29 DIAGNOSIS — Z79899 Other long term (current) drug therapy: Secondary | ICD-10-CM | POA: Insufficient documentation

## 2020-10-29 LAB — COMPREHENSIVE METABOLIC PANEL
ALT: 20 U/L (ref 0–44)
AST: 18 U/L (ref 15–41)
Albumin: 4.7 g/dL (ref 3.5–5.0)
Alkaline Phosphatase: 50 U/L (ref 38–126)
Anion gap: 7 (ref 5–15)
BUN: 12 mg/dL (ref 6–20)
CO2: 28 mmol/L (ref 22–32)
Calcium: 10.7 mg/dL — ABNORMAL HIGH (ref 8.9–10.3)
Chloride: 103 mmol/L (ref 98–111)
Creatinine, Ser: 1.24 mg/dL (ref 0.61–1.24)
GFR, Estimated: >60 mL/min (ref >60)
Glucose, Bld: 155 mg/dL — ABNORMAL HIGH (ref 70–99)
Potassium: 4.2 mmol/L (ref 3.5–5.1)
Sodium: 138 mmol/L (ref 135–145)
Total Bilirubin: 0.6 mg/dL (ref 0.3–1.2)
Total Protein: 7.8 g/dL (ref 6.5–8.1)

## 2020-10-29 LAB — CBC WITH DIFFERENTIAL/PLATELET
Abs Immature Granulocytes: 0.02 10*3/uL (ref 0.00–0.07)
Basophils Absolute: 0.1 10*3/uL (ref 0.0–0.1)
Basophils Relative: 1 %
Eosinophils Absolute: 0.2 10*3/uL (ref 0.0–0.5)
Eosinophils Relative: 2 %
HCT: 53.6 % — ABNORMAL HIGH (ref 39.0–52.0)
Hemoglobin: 17.9 g/dL — ABNORMAL HIGH (ref 13.0–17.0)
Immature Granulocytes: 0 %
Lymphocytes Relative: 21 %
Lymphs Abs: 1.9 10*3/uL (ref 0.7–4.0)
MCH: 29.9 pg (ref 26.0–34.0)
MCHC: 33.4 g/dL (ref 30.0–36.0)
MCV: 89.6 fL (ref 80.0–100.0)
Monocytes Absolute: 0.9 10*3/uL (ref 0.1–1.0)
Monocytes Relative: 9 %
Neutro Abs: 6.2 10*3/uL (ref 1.7–7.7)
Neutrophils Relative %: 67 %
Platelets: 285 10*3/uL (ref 150–400)
RBC: 5.98 MIL/uL — ABNORMAL HIGH (ref 4.22–5.81)
RDW: 13.8 % (ref 11.5–15.5)
WBC: 9.2 10*3/uL (ref 4.0–10.5)
nRBC: 0 % (ref 0.0–0.2)

## 2020-10-29 LAB — URINALYSIS, ROUTINE W REFLEX MICROSCOPIC
Bilirubin Urine: NEGATIVE
Glucose, UA: NEGATIVE mg/dL
Ketones, ur: NEGATIVE mg/dL
Leukocytes,Ua: NEGATIVE
Nitrite: NEGATIVE
RBC / HPF: >50 RBC/hpf — ABNORMAL HIGH (ref 0–5)
Specific Gravity, Urine: 1.02 (ref 1.005–1.030)
pH: 6 (ref 5.0–8.0)

## 2020-10-29 LAB — LIPASE, BLOOD: Lipase: 37 U/L (ref 11–51)

## 2020-10-29 MED ORDER — HYDROCODONE-ACETAMINOPHEN 5-325 MG PO TABS: 2.0000 | ORAL_TABLET | ORAL | 0 refills | Status: DC | PRN

## 2020-10-29 MED ORDER — IOHEXOL 300 MG/ML  SOLN
100.0000 mL | Freq: Once | INTRAMUSCULAR | Status: AC | PRN
  Administered 2020-10-29: 100 mL via INTRAVENOUS

## 2020-10-29 MED ORDER — TAMSULOSIN HCL 0.4 MG PO CAPS
0.4000 mg | ORAL_CAPSULE | Freq: Every day | ORAL | 0 refills | Status: AC
Start: 2020-10-29 — End: ?

## 2020-10-29 MED ORDER — ONDANSETRON 4 MG PO TBDP: 4.0000 mg | ORAL_TABLET | Freq: Three times a day (TID) | ORAL | 0 refills | Status: AC | PRN

## 2020-10-29 NOTE — ED Notes (Signed)
Pt given urinal.

## 2020-10-29 NOTE — ED Provider Notes (Signed)
[facility] EMERGENCY DEPT Provider Note   CSN: [id number] Arrival date & time: 10/29/20  1439     History Chief Complaint  Patient presents with  . Abdominal Pain    Ethan Arnold is a 55 y.o. male.  55 year old male with past medical history below including hypertension, hyperlipidemia, type 2 diabetes mellitus, anxiety who presents with abdominal pain.  Patient reports 3 to 4 days of relatively constant, nonradiating left lower quadrant abdominal pain.  Pain is not associated with eating and nothing makes it better or worse.  He had some mild diarrhea but denies any bloody stools or black stools.  No nausea, vomiting, or fevers.  No testicular swelling or pain and no urinary symptoms.  He has never had pain like this before.  He took an over-the-counter stomach medication yesterday without relief.  The history is provided by the patient.  Abdominal Pain      Past Medical History:  Diagnosis Date  . Diabetes mellitus without complication (HCC)   . Hypertension     Patient Active Problem List   Diagnosis Date Noted  . Delirium due to multiple etiologies 05/28/2016  . GAD (generalized anxiety disorder) 05/25/2016  . Vitamin D deficiency 05/25/2016  . Hypogonadism male 05/25/2016  . Diabetes mellitus (HCC) 05/25/2016  . Hyperlipidemia 05/25/2016  . Essential hypertension 05/25/2016  . UTI (urinary tract infection) 05/25/2016    No past surgical history on file.     Family History  Problem Relation Age of Onset  . Anxiety disorder Mother   . Diabetes Father   . Depression Father   . Anxiety disorder Other   . Mental illness Paternal Grandmother     Social History   Tobacco Use  . Smoking status: Never Smoker  . Smokeless tobacco: Never Used  Substance Use Topics  . Alcohol use: Never  . Drug use: Never    Home Medications Prior to Admission medications   Medication Sig Start Date End Date Taking? Authorizing Provider  albuterol (VENTOLIN  HFA) 108 (90 Base) MCG/ACT inhaler Inhale 2 puffs into the lungs every 4 (four) hours as needed for wheezing or shortness of breath.  09/15/15  Yes [provider]  cholecalciferol (VITAMIN D) 1000 units tablet Take 1 tablet (1,000 Units total) by mouth daily. 05/30/16  Yes Oneta Rack, NP  DULoxetine (CYMBALTA) 30 MG capsule Take 1 capsule (30 mg total) by mouth daily. 05/30/16  Yes Oneta Rack, NP  HYDROcodone-acetaminophen (NORCO/VICODIN) 5-325 MG tablet Take 2 tablets by mouth every 4 (four) hours as needed. 10/29/20  Yes Sachin Ferencz, Ambrose Finland, MD  JANUVIA 100 MG tablet Take 100 mg by mouth daily. 09/28/20  Yes [provider]  lisinopril (PRINIVIL,ZESTRIL) 5 MG tablet Take 1 tablet (5 mg total) by mouth daily. 05/30/16  Yes Oneta Rack, NP  metoprolol tartrate (LOPRESSOR) 25 MG tablet Take 1 tablet (25 mg total) by mouth 2 (two) times daily. 05/29/16  Yes Oneta Rack, NP  ondansetron (ZOFRAN ODT) 4 MG disintegrating tablet Take 1 tablet (4 mg total) by mouth every 8 (eight) hours as needed for nausea or vomiting. 10/29/20  Yes Krystyne Tewksbury, Ambrose Finland, MD  tamsulosin (FLOMAX) 0.4 MG CAPS capsule Take 1 capsule (0.4 mg total) by mouth daily. 10/29/20  Yes Aayushi Solorzano, Ambrose Finland, MD  benztropine (COGENTIN) 0.5 MG tablet Take 1 tablet (0.5 mg total) by mouth at bedtime. 05/29/16   Oneta Rack, NP  famotidine (PEPCID) 20 MG tablet Take 1 tablet (20  mg total) by mouth 2 (two) times daily. 05/29/16   Oneta RackLewis, Tanika N, NP  haloperidol (HALDOL) 0.5 MG tablet Take 5 tablets (2.5 mg total) by mouth daily. 05/30/16   Oneta RackLewis, Tanika N, NP  haloperidol (HALDOL) 5 MG tablet Take 1 tablet (5 mg total) by mouth at bedtime. 05/29/16   Oneta RackLewis, Tanika N, NP  metFORMIN (GLUCOPHAGE-XR) 500 MG 24 hr tablet Take 1 tablet (500 mg total) by mouth daily with breakfast. 05/30/16   Oneta RackLewis, Tanika N, NP  pioglitazone (ACTOS) 15 MG tablet Take 1 tablet (15 mg total) by mouth daily for 14 days. 05/17/19 05/31/19   McDonald, Mia A, PA-C  sulfamethoxazole-trimethoprim (BACTRIM DS,SEPTRA DS) 800-160 MG tablet Take 1 tablet by mouth every 12 (twelve) hours. 05/29/16   Oneta RackLewis, Tanika N, NP  testosterone cypionate (DEPOTESTOSTERONE CYPIONATE) 200 MG/ML injection Inject 200 mg into the muscle every 14 (fourteen) days.     [provider]  traZODone (DESYREL) 100 MG tablet Take 1 tablet (100 mg total) by mouth at bedtime. 05/29/16   Oneta RackLewis, Tanika N, NP    Allergies    Penicillins  Review of Systems   Review of Systems  Gastrointestinal: Positive for abdominal pain.   All other systems reviewed and are negative except that which was mentioned in HPI  Physical Exam Updated Vital Signs BP 140/73   Pulse 60   Temp 98.2 F (36.8 C) (Oral)   Resp 13   Ht 6' (1.829 m)   Wt 108.9 kg   SpO2 95%   BMI 32.55 kg/m   Physical Exam Constitutional:      General: He is not in acute distress.    Appearance: Normal appearance.  HENT:     Head: Normocephalic and atraumatic.  Eyes:     Conjunctiva/sclera: Conjunctivae normal.  Cardiovascular:     Rate and Rhythm: Normal rate and regular rhythm.     Heart sounds: Normal heart sounds. No murmur heard.   Pulmonary:     Effort: Pulmonary effort is normal.     Breath sounds: Normal breath sounds.  Abdominal:     General: Abdomen is flat. Bowel sounds are normal. There is no distension.     Palpations: Abdomen is soft.     Tenderness: There is abdominal tenderness (mild) in the left lower quadrant. There is no guarding or rebound. Negative signs include Murphy's sign.     Hernia: No hernia is present.  Musculoskeletal:     Right lower leg: No edema.     Left lower leg: No edema.  Skin:    General: Skin is warm and dry.  Neurological:     Mental Status: He is alert and oriented to person, place, and time.     Comments: fluent  Psychiatric:        Mood and Affect: Mood normal.        Behavior: Behavior normal.     ED Results / Procedures /  Treatments   Labs (all labs ordered are listed, but only abnormal results are displayed) Labs Reviewed  COMPREHENSIVE METABOLIC PANEL - Abnormal; Notable for the following components:      Result Value   Glucose, Bld 155 (*)    Calcium 10.7 (*)    All other components within normal limits  CBC WITH DIFFERENTIAL/PLATELET - Abnormal; Notable for the following components:   RBC 5.98 (*)    Hemoglobin 17.9 (*)    HCT 53.6 (*)    All other components within normal limits  URINALYSIS, ROUTINE W REFLEX MICROSCOPIC - Abnormal; Notable for the following components:   Hgb urine dipstick MODERATE (*)    Protein, ur TRACE (*)    RBC / HPF >50 (*)    All other components within normal limits  LIPASE, BLOOD    EKG None  Radiology CT Abdomen Pelvis W Contrast  Result Date: 10/29/2020 CLINICAL DATA:  Left lower quadrant abdominal pain. EXAM: CT ABDOMEN AND PELVIS WITH CONTRAST TECHNIQUE: Multidetector CT imaging of the abdomen and pelvis was performed using the standard protocol following bolus administration of intravenous contrast. CONTRAST:  OMNIPAQUE IOHEXOL 300 MG/ML  SOLN COMPARISON:  None prior FINDINGS: Lower chest: The lung bases are clear. The heart size is normal. Hepatobiliary: The liver is normal. Normal gallbladder.There is no biliary ductal dilation. Pancreas: Normal contours without ductal dilatation. No peripancreatic fluid collection. Spleen: Unremarkable. Adrenals/Urinary Tract: --Adrenal glands: Unremarkable. --Right kidney/ureter: There is a partially obstructing 7 mm stone in the proximal right ureter (axial series 2, image 47). There is a punctate nonobstructing stone in the lower pole the right kidney. There is an additional punctate minimally obstructing stone at the right UVJ (axial series 2, image 90). This stone measures approximately 2 mm. --Left kidney/ureter: No hydronephrosis or radiopaque kidney stones. --Urinary bladder: Unremarkable. Stomach/Bowel:  --Stomach/Duodenum: No hiatal hernia or other gastric abnormality. Normal duodenal course and caliber. --Small bowel: Unremarkable. --Colon: Unremarkable. --Appendix: Normal. Vascular/Lymphatic: Atherosclerotic calcification is present within the non-aneurysmal abdominal aorta, without hemodynamically significant stenosis. --No retroperitoneal lymphadenopathy. --No mesenteric lymphadenopathy. --No pelvic or inguinal lymphadenopathy. Reproductive: The prostate gland is mildly enlarged. Other: No ascites or free air. There is a small fat containing left inguinal hernia. Musculoskeletal. No acute displaced fractures. IMPRESSION: 1. Partially obstructing 7 mm stone in the proximal right ureter. 2. There is an additional punctate minimally obstructing stone at the right UVJ. Overall, there is mild right-sided hydronephrosis. Aortic Atherosclerosis (ICD10-I70.0). Electronically Signed   By: Katherine Mantle M.D.   On: 10/29/2020 18:07    Procedures Procedures   Medications Ordered in ED Medications  iohexol (OMNIPAQUE) 300 MG/ML solution 100 mL (100 mLs Intravenous Contrast Given 10/29/20 1744)    ED Course  I have reviewed the triage vital signs and the nursing notes.  Pertinent labs & imaging results that were available during my care of the patient were reviewed by me and considered in my medical decision making (see chart for details).    MDM Rules/Calculators/A&P                          Well appearing on exam, afebrile. DDx includes diverticulitis, kidney stone, colitis. Obtained labs which show normal Cr, normal LFTs and lipase, normal WBC count. UA w/ hematuria, no infection.  CT shows no acute finding in left lower quadrant however he does have an obstructing proximal 7 mm stone in right ureter and punctate stone at right UVJ with mild hydronephrosis.  Although his left-sided symptoms appear to be unrelated to this finding, given the size of the stone I have recommended urology follow-up in  clinic.  Will start on Flomax, provided with nausea and pain medications to use as needed if he develops symptoms.  Reviewed return precautions including intractable pain, intractable vomiting, or fever.  Patient voiced understanding. Final Clinical Impression(s) / ED Diagnoses Final diagnoses:  Right ureteral stone  LLQ pain    Rx / DC Orders ED Discharge Orders  Ordered    ondansetron (ZOFRAN ODT) 4 MG disintegrating tablet  Every 8 hours PRN        10/29/20 1835    HYDROcodone-acetaminophen (NORCO/VICODIN) 5-325 MG tablet  Every 4 hours PRN        10/29/20 1835    tamsulosin (FLOMAX) 0.4 MG CAPS capsule  Daily        10/29/20 1835           Aija Scarfo, Ambrose Finland, MD 10/29/20 [phone]

## 2020-10-29 NOTE — ED Triage Notes (Signed)
Pt from Home stated that he has been experiencing abdominal pain in the left lower quadrant.   Last 3 to 4 days off and on. Pt denies nausea/vomit and states he only has slight amount of diarhea. Pt's last bowel movement this morning at 1000. Pt is a type II diabetic.

## 2020-10-30 LAB — COMPREHENSIVE METABOLIC PANEL
ALT: 22 IU/L (ref 0–44)
AST: 22 IU/L (ref 0–40)
Albumin/Globulin Ratio: 2 (ref 1.2–2.2)
Albumin: 4.7 g/dL (ref 3.8–4.9)
Alkaline Phosphatase: 67 IU/L (ref 44–121)
BUN/Creatinine Ratio: 12 (ref 9–20)
BUN: 14 mg/dL (ref 6–24)
Bilirubin Total: 0.4 mg/dL (ref 0.0–1.2)
CO2: 20 mmol/L (ref 20–29)
Calcium: 10.3 mg/dL — ABNORMAL HIGH (ref 8.7–10.2)
Chloride: 102 mmol/L (ref 96–106)
Creatinine, Ser: 1.17 mg/dL (ref 0.76–1.27)
Globulin, Total: 2.4 g/dL (ref 1.5–4.5)
Glucose: 149 mg/dL — ABNORMAL HIGH (ref 65–99)
Potassium: 4.2 mmol/L (ref 3.5–5.2)
Sodium: 139 mmol/L (ref 134–144)
Total Protein: 7.1 g/dL (ref 6.0–8.5)
eGFR: 73 mL/min/{1.73_m2} (ref >59)

## 2020-10-30 LAB — CBC WITH DIFFERENTIAL/PLATELET
Basophils Absolute: 0.1 10*3/uL (ref 0.0–0.2)
Basos: 1 %
EOS (ABSOLUTE): 0.2 10*3/uL (ref 0.0–0.4)
Eos: 2 %
Hematocrit: 51.3 % — ABNORMAL HIGH (ref 37.5–51.0)
Hemoglobin: 17 g/dL (ref 13.0–17.7)
Immature Grans (Abs): 0 10*3/uL (ref 0.0–0.1)
Immature Granulocytes: 0 %
Lymphocytes Absolute: 2.2 10*3/uL (ref 0.7–3.1)
Lymphs: 21 %
MCH: 30.8 pg (ref 26.6–33.0)
MCHC: 33.1 g/dL (ref 31.5–35.7)
MCV: 93 fL (ref 79–97)
Monocytes Absolute: 1 10*3/uL — ABNORMAL HIGH (ref 0.1–0.9)
Monocytes: 10 %
Neutrophils Absolute: 6.6 10*3/uL (ref 1.4–7.0)
Neutrophils: 66 %
Platelets: 287 10*3/uL (ref 150–450)
RBC: 5.52 x10E6/uL (ref 4.14–5.80)
RDW: 13.6 % (ref 11.6–15.4)
WBC: 10.2 10*3/uL (ref 3.4–10.8)

## 2020-10-30 LAB — LIPASE: Lipase: 43 U/L (ref 13–78)

## 2020-11-04 ENCOUNTER — Other Ambulatory Visit: Payer: Self-pay | Admitting: Urology

## 2020-11-04 DIAGNOSIS — N200 Calculus of kidney: Secondary | ICD-10-CM

## 2020-11-10 ENCOUNTER — Other Ambulatory Visit (HOSPITAL_COMMUNITY)
Admission: RE | Admit: 2020-11-10 | Discharge: 2020-11-10 | Disposition: A | Discharge status: Home / Self Care | Payer: Federal, State, Local not specified - PPO | Source: Ambulatory Visit | Attending: Urology | Admitting: Urology

## 2020-11-10 DIAGNOSIS — Z01812 Encounter for preprocedural laboratory examination: Secondary | ICD-10-CM | POA: Insufficient documentation

## 2020-11-10 DIAGNOSIS — Z6833 Body mass index (BMI) 33.0-33.9, adult: Secondary | ICD-10-CM | POA: Diagnosis not present

## 2020-11-10 DIAGNOSIS — Z20822 Contact with and (suspected) exposure to covid-19: Secondary | ICD-10-CM | POA: Insufficient documentation

## 2020-11-10 DIAGNOSIS — Z880 Allergy status to penicillin: Secondary | ICD-10-CM | POA: Diagnosis not present

## 2020-11-10 DIAGNOSIS — E669 Obesity, unspecified: Secondary | ICD-10-CM | POA: Diagnosis not present

## 2020-11-10 DIAGNOSIS — N201 Calculus of ureter: Secondary | ICD-10-CM | POA: Diagnosis present

## 2020-11-10 LAB — SARS CORONAVIRUS 2 (TAT 6-24 HRS): SARS Coronavirus 2: NEGATIVE

## 2020-11-10 NOTE — Progress Notes (Signed)
Patient contacted about procedure.  He is to arrive at 12:00 p.m.  NPO after midnight, clear liquids until 10:00 a.m. day of procedure.  Patient to take medications with a sip of water.  He will quarantine until Thursday.  His wife will be his driver.

## 2020-11-13 ENCOUNTER — Ambulatory Visit (HOSPITAL_COMMUNITY): Payer: Federal, State, Local not specified - PPO

## 2020-11-13 ENCOUNTER — Ambulatory Visit (HOSPITAL_BASED_OUTPATIENT_CLINIC_OR_DEPARTMENT_OTHER)
Admission: RE | Admit: 2020-11-13 | Discharge: 2020-11-13 | Disposition: A | Discharge status: Home / Self Care | Payer: Federal, State, Local not specified - PPO | Attending: Urology | Admitting: Urology

## 2020-11-13 ENCOUNTER — Encounter (HOSPITAL_BASED_OUTPATIENT_CLINIC_OR_DEPARTMENT_OTHER)
Admission: RE | Disposition: A | Discharge status: Home / Self Care | Payer: Self-pay | Source: Home / Self Care | Attending: Urology

## 2020-11-13 ENCOUNTER — Encounter (HOSPITAL_BASED_OUTPATIENT_CLINIC_OR_DEPARTMENT_OTHER): Payer: Self-pay | Admitting: Urology

## 2020-11-13 DIAGNOSIS — N200 Calculus of kidney: Secondary | ICD-10-CM

## 2020-11-13 DIAGNOSIS — Z880 Allergy status to penicillin: Secondary | ICD-10-CM | POA: Insufficient documentation

## 2020-11-13 DIAGNOSIS — Z6833 Body mass index (BMI) 33.0-33.9, adult: Secondary | ICD-10-CM | POA: Insufficient documentation

## 2020-11-13 DIAGNOSIS — Z20822 Contact with and (suspected) exposure to covid-19: Secondary | ICD-10-CM | POA: Insufficient documentation

## 2020-11-13 DIAGNOSIS — E669 Obesity, unspecified: Secondary | ICD-10-CM | POA: Insufficient documentation

## 2020-11-13 DIAGNOSIS — N201 Calculus of ureter: Secondary | ICD-10-CM | POA: Insufficient documentation

## 2020-11-13 HISTORY — PX: EXTRACORPOREAL SHOCK WAVE LITHOTRIPSY: SHX1557

## 2020-11-13 LAB — GLUCOSE, CAPILLARY: Glucose-Capillary: 139 mg/dL — ABNORMAL HIGH (ref 70–99)

## 2020-11-13 SURGERY — LITHOTRIPSY, ESWL
Anesthesia: LOCAL | Laterality: Right

## 2020-11-13 MED ORDER — CIPROFLOXACIN HCL 500 MG PO TABS
500.0000 mg | ORAL_TABLET | ORAL | Status: AC
  Administered 2020-11-13: 500 mg via ORAL

## 2020-11-13 MED ORDER — DIAZEPAM 5 MG PO TABS
10.0000 mg | ORAL_TABLET | ORAL | Status: AC
  Administered 2020-11-13: 10 mg via ORAL

## 2020-11-13 MED ORDER — CIPROFLOXACIN HCL 500 MG PO TABS
ORAL_TABLET | ORAL | Status: AC
  Filled 2020-11-13: qty 1

## 2020-11-13 MED ORDER — HYDROCODONE-ACETAMINOPHEN 5-325 MG PO TABS: 2.0000 | ORAL_TABLET | ORAL | 0 refills | Status: AC | PRN

## 2020-11-13 MED ORDER — DIAZEPAM 5 MG PO TABS
ORAL_TABLET | ORAL | Status: AC
  Filled 2020-11-13: qty 2

## 2020-11-13 MED ORDER — SODIUM CHLORIDE 0.9 % IV SOLN: INTRAVENOUS | Status: DC

## 2020-11-13 MED ORDER — DIPHENHYDRAMINE HCL 25 MG PO CAPS
25.0000 mg | ORAL_CAPSULE | ORAL | Status: AC
  Administered 2020-11-13: 25 mg via ORAL

## 2020-11-13 MED ORDER — DIPHENHYDRAMINE HCL 25 MG PO CAPS
ORAL_CAPSULE | ORAL | Status: AC
  Filled 2020-11-13: qty 1

## 2020-11-13 NOTE — Discharge Instructions (Signed)

## 2020-11-13 NOTE — H&P (Signed)
See scanned H&P form [facility]

## 2020-11-13 NOTE — Op Note (Addendum)
[facility] Operative Note  Treating Physician: Rhoderick Moody, MD  Pre-op diagnosis: 7 mm right UPJ stone  Post-op diagnosis: Same   Procedure: Right ESWL  See Rojelio Brenner OP note scanned into chart. Also because of the size, density, location and other factors that cannot be anticipated I feel this will likely be a staged procedure. This fact supersedes any indication in the scanned [place] [facility] operative note to the contrary

## 2020-11-14 ENCOUNTER — Encounter (HOSPITAL_BASED_OUTPATIENT_CLINIC_OR_DEPARTMENT_OTHER): Payer: Self-pay | Admitting: Urology

## 2021-07-15 ENCOUNTER — Other Ambulatory Visit: Payer: Self-pay

## 2021-07-15 ENCOUNTER — Encounter: Payer: Self-pay | Admitting: Emergency Medicine

## 2021-07-15 ENCOUNTER — Ambulatory Visit
Admission: EM | Admit: 2021-07-15 | Discharge: 2021-07-15 | Disposition: A | Discharge status: Home / Self Care | Payer: Federal, State, Local not specified - PPO | Attending: Internal Medicine | Admitting: Internal Medicine

## 2021-07-15 DIAGNOSIS — J029 Acute pharyngitis, unspecified: Secondary | ICD-10-CM | POA: Insufficient documentation

## 2021-07-15 DIAGNOSIS — J069 Acute upper respiratory infection, unspecified: Secondary | ICD-10-CM | POA: Insufficient documentation

## 2021-07-15 LAB — POCT RAPID STREP A (OFFICE): Rapid Strep A Screen: NEGATIVE

## 2021-07-15 MED ORDER — BENZONATATE 100 MG PO CAPS: 100.0000 mg | ORAL_CAPSULE | Freq: Three times a day (TID) | ORAL | 0 refills | Status: AC | PRN

## 2021-07-15 NOTE — Discharge Instructions (Signed)
Your rapid strep test was negative.  Throat culture and COVID-19 and flu swabs are pending.  We will call if they are positive.  It appears that you have a viral upper respiratory infection that should resolve in the next few days with symptomatic treatment.  You have been prescribed a medication to take as needed for cough.  Also recommend Coricidin HBP as this is safe with high blood pressure.

## 2021-07-15 NOTE — ED Triage Notes (Signed)
Patient c/o sore throat, cough, congestion x 3 days.  Sore throat worsen last night.  Patient denies any OTC meds.  Patient is vaccinated for COVID.

## 2021-07-15 NOTE — ED Provider Notes (Signed)
[facility]    CSN: [id number] Arrival date & time: 07/15/21  1037      History   Chief Complaint Chief Complaint  Patient presents with   Sore Throat    HPI Ethan Arnold is a 55 y.o. male.   Patient presents with sore throat, nonproductive cough, nasal congestion x3 days.  Patient denies any known sick contacts or fevers.  Sore throat worsened last night for patient.  Patient has not yet taken any medications to help alleviate symptoms.  Denies nausea, vomiting, diarrhea, abdominal pain, chest pain, shortness of breath.   Sore Throat   Past Medical History:  Diagnosis Date   Diabetes mellitus without complication (HCC)    Hypertension     Patient Active Problem List   Diagnosis Date Noted   Delirium due to multiple etiologies 05/28/2016   GAD (generalized anxiety disorder) 05/25/2016   Vitamin D deficiency 05/25/2016   Hypogonadism male 05/25/2016   Diabetes mellitus (HCC) 05/25/2016   Hyperlipidemia 05/25/2016   Essential hypertension 05/25/2016   UTI (urinary tract infection) 05/25/2016    Past Surgical History:  Procedure Laterality Date   EXTRACORPOREAL SHOCK WAVE LITHOTRIPSY Right 11/13/2020   Procedure: RIGHT EXTRACORPOREAL SHOCK WAVE LITHOTRIPSY ([facility]);  Surgeon: Rene Paci, MD;  Location: [facility];  Service: Urology;  Laterality: Right;  REQUESTING150 MINS       Home Medications    Prior to Admission medications   Medication Sig Start Date End Date Taking? Authorizing Provider  albuterol (VENTOLIN HFA) 108 (90 Base) MCG/ACT inhaler Inhale 2 puffs into the lungs every 4 (four) hours as needed for wheezing or shortness of breath.  09/15/15  Yes [provider]  benzonatate (TESSALON) 100 MG capsule Take 1 capsule (100 mg total) by mouth every 8 (eight) hours as needed for cough. 07/15/21  Yes , Rolly Salter E, FNP  benztropine (COGENTIN) 0.5 MG tablet Take 1 tablet (0.5 mg total) by mouth at bedtime.  05/29/16  Yes Oneta Rack, NP  cholecalciferol (VITAMIN D) 1000 units tablet Take 1 tablet (1,000 Units total) by mouth daily. 05/30/16  Yes Oneta Rack, NP  DULoxetine (CYMBALTA) 30 MG capsule Take 1 capsule (30 mg total) by mouth daily. 05/30/16  Yes Oneta Rack, NP  famotidine (PEPCID) 20 MG tablet Take 1 tablet (20 mg total) by mouth 2 (two) times daily. 05/29/16  Yes Oneta Rack, NP  haloperidol (HALDOL) 0.5 MG tablet Take 5 tablets (2.5 mg total) by mouth daily. 05/30/16  Yes Oneta Rack, NP  haloperidol (HALDOL) 5 MG tablet Take 1 tablet (5 mg total) by mouth at bedtime. 05/29/16  Yes Oneta Rack, NP  HYDROcodone-acetaminophen (NORCO/VICODIN) 5-325 MG tablet Take 2 tablets by mouth every 4 (four) hours as needed. 11/13/20  Yes Rene Paci, MD  JANUVIA 100 MG tablet Take 100 mg by mouth daily. 09/28/20  Yes [provider]  lisinopril (PRINIVIL,ZESTRIL) 5 MG tablet Take 1 tablet (5 mg total) by mouth daily. 05/30/16  Yes Oneta Rack, NP  metFORMIN (GLUCOPHAGE-XR) 500 MG 24 hr tablet Take 1 tablet (500 mg total) by mouth daily with breakfast. 05/30/16  Yes Oneta Rack, NP  metoprolol tartrate (LOPRESSOR) 25 MG tablet Take 1 tablet (25 mg total) by mouth 2 (two) times daily. 05/29/16  Yes Oneta Rack, NP  ondansetron (ZOFRAN ODT) 4 MG disintegrating tablet Take 1 tablet (4 mg total) by mouth every 8 (eight) hours as needed for nausea  or vomiting. 10/29/20  Yes Little, Wenda Overland, MD  sulfamethoxazole-trimethoprim (BACTRIM DS,SEPTRA DS) 800-160 MG tablet Take 1 tablet by mouth every 12 (twelve) hours. 05/29/16  Yes Derrill Center, NP  tamsulosin (FLOMAX) 0.4 MG CAPS capsule Take 1 capsule (0.4 mg total) by mouth daily. 10/29/20  Yes Little, Wenda Overland, MD  testosterone cypionate (DEPOTESTOSTERONE CYPIONATE) 200 MG/ML injection Inject 200 mg into the muscle every 14 (fourteen) days.    Yes [provider]  traZODone (DESYREL) 100 MG tablet  Take 1 tablet (100 mg total) by mouth at bedtime. 05/29/16  Yes Derrill Center, NP  pioglitazone (ACTOS) 15 MG tablet Take 1 tablet (15 mg total) by mouth daily for 14 days. 05/17/19 05/31/19  McDonald, Laymond Purser, PA-C    Family History Family History  Problem Relation Age of Onset   Anxiety disorder Mother    Diabetes Father    Depression Father    Anxiety disorder Other    Mental illness Paternal Grandmother     Social History Social History   Tobacco Use   Smoking status: Never   Smokeless tobacco: Never  Substance Use Topics   Alcohol use: Never   Drug use: Never     Allergies   Penicillins   Review of Systems Review of Systems Per HPI  Physical Exam Triage Vital Signs ED Triage Vitals  Enc Vitals Group     BP 07/15/21 1243 136/86     Pulse Rate 07/15/21 1243 77     Resp 07/15/21 1243 18     Temp 07/15/21 1243 98.3 F (36.8 C)     Temp Source 07/15/21 1243 Oral     SpO2 07/15/21 1243 95 %     Weight 07/15/21 1244 245 lb (111.1 kg)     Height 07/15/21 1244 6' (1.829 m)     Head Circumference --      Peak Flow --      Pain Score 07/15/21 1244 7     Pain Loc --      Pain Edu? --      Excl. in [place]? --    No data found.  Updated Vital Signs BP 136/86 (BP Location: Left Arm)   Pulse 77   Temp 98.3 F (36.8 C) (Oral)   Resp 18   Ht 6' (1.829 m)   Wt 245 lb (111.1 kg)   SpO2 95%   BMI 33.23 kg/m   Visual Acuity Right Eye Distance:   Left Eye Distance:   Bilateral Distance:    Right Eye Near:   Left Eye Near:    Bilateral Near:     Physical Exam Constitutional:      General: He is not in acute distress.    Appearance: Normal appearance. He is not toxic-appearing or diaphoretic.  HENT:     Head: Normocephalic and atraumatic.     Right Ear: Tympanic membrane and ear canal normal.     Left Ear: Tympanic membrane and ear canal normal.     Nose: Congestion present.     Mouth/Throat:     Mouth: Mucous membranes are moist.     Pharynx: Posterior  oropharyngeal erythema present.  Eyes:     Extraocular Movements: Extraocular movements intact.     Conjunctiva/sclera: Conjunctivae normal.     Pupils: Pupils are equal, round, and reactive to light.  Cardiovascular:     Rate and Rhythm: Normal rate and regular rhythm.     Pulses: Normal pulses.  Heart sounds: Normal heart sounds.  Pulmonary:     Effort: Pulmonary effort is normal. No respiratory distress.     Breath sounds: Normal breath sounds. No wheezing.  Abdominal:     General: Abdomen is flat. Bowel sounds are normal.     Palpations: Abdomen is soft.  Musculoskeletal:        General: Normal range of motion.     Cervical back: Normal range of motion.  Skin:    General: Skin is warm and dry.  Neurological:     General: No focal deficit present.     Mental Status: He is alert and oriented to person, place, and time. Mental status is at baseline.  Psychiatric:        Mood and Affect: Mood normal.        Behavior: Behavior normal.     UC Treatments / Results  Labs (all labs ordered are listed, but only abnormal results are displayed) Labs Reviewed  COVID-19, FLU A+B NAA  CULTURE, GROUP A STREP [facility])  POCT RAPID STREP A (OFFICE)    EKG   Radiology No results found.  Procedures Procedures (including critical care time)  Medications Ordered in UC Medications - No data to display  Initial Impression / Assessment and Plan / UC Course  I have reviewed the triage vital signs and the nursing notes.  Pertinent labs & imaging results that were available during my care of the patient were reviewed by me and considered in my medical decision making (see chart for details).     Patient presents with symptoms likely from a viral upper respiratory infection. Differential includes bacterial pneumonia, sinusitis, allergic rhinitis, Covid 19. Do not suspect underlying cardiopulmonary process. Symptoms seem unlikely related to ACS, CHF or COPD exacerbations, pneumonia,  pneumothorax. Patient is nontoxic appearing and not in need of emergent medical intervention.  Rapid strep test was negative.  COVID-19, flu swab, throat culture pending.  Recommended symptom control with over the counter medications that are safe with blood pressure such as Coricidin HBP.  Benzonatate prescribed to take as needed for cough.  Return if symptoms fail to improve in 1-2 weeks or you develop shortness of breath, chest pain, severe headache. Patient states understanding and is agreeable.  Discharged with PCP followup.  Final Clinical Impressions(s) / UC Diagnoses   Final diagnoses:  Viral upper respiratory tract infection with cough  Sore throat     Discharge Instructions      Your rapid strep test was negative.  Throat culture and COVID-19 and flu swabs are pending.  We will call if they are positive.  It appears that you have a viral upper respiratory infection that should resolve in the next few days with symptomatic treatment.  You have been prescribed a medication to take as needed for cough.  Also recommend Coricidin HBP as this is safe with high blood pressure.    ED Prescriptions     Medication Sig Dispense Auth. Provider   benzonatate (TESSALON) 100 MG capsule Take 1 capsule (100 mg total) by mouth every 8 (eight) hours as needed for cough. 21 capsule [place], Michele Rockers, [place]      PDMP not reviewed this encounter.   Teodora Medici,  07/15/21 1319

## 2021-07-16 LAB — COVID-19, FLU A+B NAA
Influenza A, NAA: NOT DETECTED
Influenza B, NAA: NOT DETECTED
SARS-CoV-2, NAA: NOT DETECTED

## 2021-07-18 ENCOUNTER — Telehealth (HOSPITAL_COMMUNITY): Payer: Self-pay | Admitting: Emergency Medicine

## 2021-07-18 LAB — CULTURE, GROUP A STREP (THRC)

## 2021-07-18 MED ORDER — AZITHROMYCIN 250 MG PO TABS: 250.0000 mg | ORAL_TABLET | Freq: Every day | ORAL | 0 refills | Status: AC

## 2021-08-25 ENCOUNTER — Other Ambulatory Visit: Payer: Self-pay | Admitting: Internal Medicine

## 2021-08-25 ENCOUNTER — Other Ambulatory Visit (HOSPITAL_COMMUNITY): Payer: Self-pay | Admitting: Internal Medicine

## 2021-09-03 ENCOUNTER — Ambulatory Visit (HOSPITAL_COMMUNITY)
Admission: RE | Admit: 2021-09-03 | Discharge: 2021-09-03 | Disposition: A | Discharge status: Home / Self Care | Payer: Federal, State, Local not specified - PPO | Source: Ambulatory Visit | Attending: Internal Medicine | Admitting: Internal Medicine

## 2021-09-03 ENCOUNTER — Other Ambulatory Visit: Payer: Self-pay

## 2021-09-03 ENCOUNTER — Encounter (HOSPITAL_COMMUNITY)
Admission: RE | Admit: 2021-09-03 | Discharge: 2021-09-03 | Disposition: A | Discharge status: Home / Self Care | Payer: Federal, State, Local not specified - PPO | Source: Ambulatory Visit | Attending: Internal Medicine | Admitting: Internal Medicine

## 2021-09-03 MED ORDER — TECHNETIUM TC 99M SESTAMIBI GENERIC - CARDIOLITE
26.3000 | Freq: Once | INTRAVENOUS | Status: AC | PRN
  Administered 2021-09-03: 26.3 via INTRAVENOUS

## 2022-01-03 ENCOUNTER — Emergency Department (HOSPITAL_BASED_OUTPATIENT_CLINIC_OR_DEPARTMENT_OTHER)
Admission: EM | Admit: 2022-01-03 | Discharge: 2022-01-03 | Disposition: A | Discharge status: Home / Self Care | Payer: Federal, State, Local not specified - PPO | Attending: Emergency Medicine | Admitting: Emergency Medicine

## 2022-01-03 ENCOUNTER — Emergency Department (HOSPITAL_BASED_OUTPATIENT_CLINIC_OR_DEPARTMENT_OTHER): Payer: Federal, State, Local not specified - PPO

## 2022-01-03 ENCOUNTER — Encounter (HOSPITAL_BASED_OUTPATIENT_CLINIC_OR_DEPARTMENT_OTHER): Payer: Self-pay | Admitting: Emergency Medicine

## 2022-01-03 DIAGNOSIS — M25562 Pain in left knee: Secondary | ICD-10-CM | POA: Diagnosis present

## 2022-01-03 NOTE — Discharge Instructions (Addendum)
Please use Tylenol or ibuprofen for pain.  You may use 600 mg ibuprofen every 6 hours or 1000 mg of Tylenol every 6 hours.  You may choose to alternate between the 2.  This would be most effective.  Not to exceed 4 g of Tylenol within 24 hours.  Not to exceed 3200 mg ibuprofen 24 hours. ? ?Continue to brace, use ice as needed for pain, inflammation. You can continue weight bearing as tolerated. ?

## 2022-01-03 NOTE — ED Provider Notes (Signed)
. ?[facility] EMERGENCY DEPARTMENT ?Provider Note ? ? ?CSN: [id number] ?Arrival date & time: 01/03/22  1352 ? ?  ? ?History ? ?Chief Complaint  ?Patient presents with  ? Knee Pain  ? ? ?Demorio L Yoo is a 56 y.o. male with noncontributory past medical history presents with concern for left knee pain on Friday, feeling like his leg was giving out.  Patient reports that it is improved since then he has been wearing a knee brace.  He denies significant pain, numbness, tingling he denies any recent injury, previous surgery to the affected knee.  Denies any fever, chills, purulent drainage to the affected knee.  Patient reports that it feels 100% better today he has not had any pain. ? ? ?Knee Pain ? ?  ? ?Home Medications ?Prior to Admission medications   ?Medication Sig Start Date End Date Taking? Authorizing Provider  ?albuterol (VENTOLIN HFA) 108 (90 Base) MCG/ACT inhaler Inhale 2 puffs into the lungs every 4 (four) hours as needed for wheezing or shortness of breath.  09/15/15   [provider]  ?azithromycin (ZITHROMAX) 250 MG tablet Take 1 tablet (250 mg total) by mouth daily. Take first 2 tablets together, then 1 every day until finished. 07/18/21   LampteyBritta Mccreedy, MD  ?benzonatate (TESSALON) 100 MG capsule Take 1 capsule (100 mg total) by mouth every 8 (eight) hours as needed for cough. 07/15/21   Gustavus Bryant, FNP  ?benztropine (COGENTIN) 0.5 MG tablet Take 1 tablet (0.5 mg total) by mouth at bedtime. 05/29/16   Oneta Rack, NP  ?cholecalciferol (VITAMIN D) 1000 units tablet Take 1 tablet (1,000 Units total) by mouth daily. 05/30/16   Oneta Rack, NP  ?DULoxetine (CYMBALTA) 30 MG capsule Take 1 capsule (30 mg total) by mouth daily. 05/30/16   Oneta Rack, NP  ?famotidine (PEPCID) 20 MG tablet Take 1 tablet (20 mg total) by mouth 2 (two) times daily. 05/29/16   Oneta Rack, NP  ?haloperidol (HALDOL) 0.5 MG tablet Take 5 tablets (2.5 mg total) by mouth daily. 05/30/16   Oneta Rack, NP  ?haloperidol (HALDOL) 5 MG tablet Take 1 tablet (5 mg total) by mouth at bedtime. 05/29/16   Oneta Rack, NP  ?HYDROcodone-acetaminophen (NORCO/VICODIN) 5-325 MG tablet Take 2 tablets by mouth every 4 (four) hours as needed. 11/13/20   Rene Paci, MD  ?JANUVIA 100 MG tablet Take 100 mg by mouth daily. 09/28/20   [provider]  ?lisinopril (PRINIVIL,ZESTRIL) 5 MG tablet Take 1 tablet (5 mg total) by mouth daily. 05/30/16   Oneta Rack, NP  ?metFORMIN (GLUCOPHAGE-XR) 500 MG 24 hr tablet Take 1 tablet (500 mg total) by mouth daily with breakfast. 05/30/16   Oneta Rack, NP  ?metoprolol tartrate (LOPRESSOR) 25 MG tablet Take 1 tablet (25 mg total) by mouth 2 (two) times daily. 05/29/16   Oneta Rack, NP  ?ondansetron (ZOFRAN ODT) 4 MG disintegrating tablet Take 1 tablet (4 mg total) by mouth every 8 (eight) hours as needed for nausea or vomiting. 10/29/20   Little, Ambrose Finland, MD  ?pioglitazone (ACTOS) 15 MG tablet Take 1 tablet (15 mg total) by mouth daily for 14 days. 05/17/19 05/31/19  McDonald, Mia A, PA-C  ?sulfamethoxazole-trimethoprim (BACTRIM DS,SEPTRA DS) 800-160 MG tablet Take 1 tablet by mouth every 12 (twelve) hours. 05/29/16   Oneta Rack, NP  ?tamsulosin (FLOMAX) 0.4 MG CAPS capsule Take 1 capsule (0.4 mg total) by mouth daily.  10/29/20   Little, Ambrose Finland, MD  ?testosterone cypionate (DEPOTESTOSTERONE CYPIONATE) 200 MG/ML injection Inject 200 mg into the muscle every 14 (fourteen) days.     [provider]  ?traZODone (DESYREL) 100 MG tablet Take 1 tablet (100 mg total) by mouth at bedtime. 05/29/16   Oneta Rack, NP  ?   ? ?Allergies    ?Penicillins   ? ?Review of Systems   ?Review of Systems  ?Musculoskeletal:  Positive for arthralgias.  ?All other systems reviewed and are negative. ? ?Physical Exam ?Updated Vital Signs ?BP 136/81 (BP Location: Left Arm)   Pulse 70   Temp 97.8 ?F (36.6 ?C) (Oral)   Resp 16   SpO2 95%  ?Physical  Exam ?Vitals and nursing note reviewed.  ?Constitutional:   ?   General: He is not in acute distress. ?   Appearance: Normal appearance.  ?HENT:  ?   Head: Normocephalic and atraumatic.  ?Eyes:  ?   General:     ?   Right eye: No discharge.     ?   Left eye: No discharge.  ?Cardiovascular:  ?   Rate and Rhythm: Normal rate and regular rhythm.  ?   Pulses: Normal pulses.  ?Pulmonary:  ?   Effort: Pulmonary effort is normal. No respiratory distress.  ?Musculoskeletal:     ?   General: No deformity.  ?   Comments: No anterior, posterior drawer laxity, no varus, valgus laxity.  No effusion, negative balloon and ballottement test.  Negative McMurray's.  Overall normal appearance of entire left knee.  Intact strength 5 out of 5 to flexion extension of the left knee.  ?Skin: ?   General: Skin is warm and dry.  ?   Capillary Refill: Capillary refill takes less than 2 seconds.  ?Neurological:  ?   Mental Status: He is alert and oriented to person, place, and time.  ?Psychiatric:     ?   Mood and Affect: Mood normal.     ?   Behavior: Behavior normal.  ? ? ?ED Results / Procedures / Treatments   ?Labs ?(all labs ordered are listed, but only abnormal results are displayed) ?Labs Reviewed - No data to display ? ?EKG ?None ? ?Radiology ?DG Knee Complete 4 Views Left ? ?Result Date: 01/03/2022 ?CLINICAL DATA:  knee pain EXAM: LEFT KNEE - COMPLETE 4+ VIEW COMPARISON:  None Available. FINDINGS: No acute fracture or dislocation. Joint spaces and alignment are maintained. No area of erosion or osseous destruction. No unexpected radiopaque foreign body. Soft tissues are unremarkable. IMPRESSION: No acute fracture or dislocation. Electronically Signed   By: Meda Klinefelter M.D.   On: 01/03/2022 14:34   ? ?Procedures ?Procedures  ? ? ?Medications Ordered in ED ?Medications - No data to display ? ?ED Course/ Medical Decision Making/ A&P ?  ?                        ?Medical Decision Making ?Amount and/or Complexity of Data  Reviewed ?Radiology: ordered. ? ? ?Overall well-appearing 56 year old male who presents with left knee pain, feeling like it was giving out 2 days ago.  He has no pain today.  My emergent differential diagnosis includes acute fracture, dislocation, Collateral ligament injury, septic arthritis or other arthritis, bursitis versus other.  This is not an exhaustive differential. ? ?I independently interpreted imaging including plain film radiograph of the left knee which shows no acute fracture, dislocation. I agree with the radiologist  interpretation. ? ?On my physical exam patient with neurovascularly intact left knee.  He has no tenderness to palpation of bony extremities.  He is intact strength throughout he can walk without difficulty.  Patient does not seem to have any sequelae or remnants of the left knee pain or feeling like it was giving out today.  Encouraged I Profen, Tylenol as needed for pain, continue wearing knee brace, follow-up with orthopedics as needed.  Patient discharged in stable condition at this time, I do not note any abnormality to explain patient's findings on my exam and workup. ? ?Final Clinical Impression(s) / ED Diagnoses ?Final diagnoses:  ?Acute pain of left knee  ? ? ?Rx / DC Orders ?ED Discharge Orders   ? ? None  ? ?  ? ? ?  ?Olene Flossrosperi, Taequan Stockhausen H, PA-C ?01/03/22 1544 ? ?  ?Milagros Lollykstra, Richard S, MD ?01/03/22 1748 ? ?

## 2022-01-03 NOTE — ED Notes (Signed)
Patient states his knee pain started Friday night . Denies any injury to leg unsure of reason leg pain started ?

## 2022-01-03 NOTE — ED Notes (Signed)
Pt observed by secretary leaving department prior to discharge paperwork completed.  Provider made aware of elopement.   ?

## 2022-01-03 NOTE — ED Triage Notes (Signed)
Pt reports intermittent left knee pain and difficulty bearing weight since Friday night. Denies injury. Pt ambulatory. ?

## 2022-03-05 ENCOUNTER — Ambulatory Visit
Admission: EM | Admit: 2022-03-05 | Discharge: 2022-03-05 | Disposition: A | Discharge status: Home / Self Care | Payer: Federal, State, Local not specified - PPO | Attending: Student | Admitting: Student

## 2022-03-05 ENCOUNTER — Encounter: Payer: Self-pay | Admitting: Emergency Medicine

## 2022-03-05 DIAGNOSIS — M545 Low back pain, unspecified: Secondary | ICD-10-CM

## 2022-03-05 DIAGNOSIS — Z794 Long term (current) use of insulin: Secondary | ICD-10-CM

## 2022-03-05 DIAGNOSIS — E1169 Type 2 diabetes mellitus with other specified complication: Secondary | ICD-10-CM | POA: Diagnosis not present

## 2022-03-05 LAB — POCT URINALYSIS DIP (MANUAL ENTRY)
Bilirubin, UA: NEGATIVE
Blood, UA: NEGATIVE
Glucose, UA: NEGATIVE mg/dL
Ketones, POC UA: NEGATIVE mg/dL
Leukocytes, UA: NEGATIVE
Nitrite, UA: NEGATIVE
Protein Ur, POC: NEGATIVE mg/dL
Spec Grav, UA: 1.025 (ref 1.010–1.025)
Urobilinogen, UA: 1 E.U./dL
pH, UA: 6 (ref 5.0–8.0)

## 2022-03-05 LAB — POCT FASTING CBG KUC MANUAL ENTRY
POCT Glucose (KUC): 124 mg/dL — AB (ref 70–99)
POCT Glucose (KUC): 124 mg/dL — AB (ref 70–99)

## 2022-03-05 NOTE — ED Provider Notes (Signed)
[facility]    CSN: [id number] Arrival date & time: 03/05/22  1654      History   Chief Complaint Chief Complaint  Patient presents with   Back Pain   Dizziness    HPI Ethan Arnold is a 56 y.o. male presenting with lower back pain for 2 weeks and lightheadedness for years. History diabetes, insulin controlled. Distant history UTI.  Describes positional lower back pain without radiation.  Denies trauma or overuse prior to the back pain.  Also with lightheadedness for years, states that this is slightly worse over the last week and he is concerned that he has another UTI.  Denies any urinary symptoms.  Describes this as constant lightheadedness that is worse with movement.  Denies chest pain, shortness of breath, dizziness, weakness. Denies pain shooting down legs, denies numbness in arms/legs, denies weakness in arms/legs, denies saddle anesthesia, denies bowel/bladder incontinence, denies urinary retention, denies constipation.  HPI  Past Medical History:  Diagnosis Date   Diabetes mellitus without complication (HCC)    Hypertension     Patient Active Problem List   Diagnosis Date Noted   Delirium due to multiple etiologies 05/28/2016   GAD (generalized anxiety disorder) 05/25/2016   Vitamin D deficiency 05/25/2016   Hypogonadism male 05/25/2016   Diabetes mellitus (HCC) 05/25/2016   Hyperlipidemia 05/25/2016   Essential hypertension 05/25/2016   UTI (urinary tract infection) 05/25/2016    Past Surgical History:  Procedure Laterality Date   EXTRACORPOREAL SHOCK WAVE LITHOTRIPSY Right 11/13/2020   Procedure: RIGHT EXTRACORPOREAL SHOCK WAVE LITHOTRIPSY (ESWL);  Surgeon: Rene Paci, MD;  Location: [facility];  Service: Urology;  Laterality: Right;  REQUESTING150 MINS       Home Medications    Prior to Admission medications   Medication Sig Start Date End Date Taking? Authorizing Provider  albuterol (VENTOLIN HFA) 108 (90  Base) MCG/ACT inhaler Inhale 2 puffs into the lungs every 4 (four) hours as needed for wheezing or shortness of breath.  09/15/15   [provider]  azithromycin (ZITHROMAX) 250 MG tablet Take 1 tablet (250 mg total) by mouth daily. Take first 2 tablets together, then 1 every day until finished. 07/18/21   Lamptey, Britta Mccreedy, MD  benzonatate (TESSALON) 100 MG capsule Take 1 capsule (100 mg total) by mouth every 8 (eight) hours as needed for cough. 07/15/21   Gustavus Bryant, FNP  benztropine (COGENTIN) 0.5 MG tablet Take 1 tablet (0.5 mg total) by mouth at bedtime. 05/29/16   Oneta Rack, NP  cholecalciferol (VITAMIN D) 1000 units tablet Take 1 tablet (1,000 Units total) by mouth daily. 05/30/16   Oneta Rack, NP  DULoxetine (CYMBALTA) 30 MG capsule Take 1 capsule (30 mg total) by mouth daily. 05/30/16   Oneta Rack, NP  famotidine (PEPCID) 20 MG tablet Take 1 tablet (20 mg total) by mouth 2 (two) times daily. 05/29/16   Oneta Rack, NP  haloperidol (HALDOL) 0.5 MG tablet Take 5 tablets (2.5 mg total) by mouth daily. 05/30/16   Oneta Rack, NP  haloperidol (HALDOL) 5 MG tablet Take 1 tablet (5 mg total) by mouth at bedtime. 05/29/16   Oneta Rack, NP  HYDROcodone-acetaminophen (NORCO/VICODIN) 5-325 MG tablet Take 2 tablets by mouth every 4 (four) hours as needed. 11/13/20   Rene Paci, MD  JANUVIA 100 MG tablet Take 100 mg by mouth daily. 09/28/20   [provider]  lisinopril (PRINIVIL,ZESTRIL) 5 MG tablet Take 1  tablet (5 mg total) by mouth daily. 05/30/16   Derrill Center, NP  metFORMIN (GLUCOPHAGE-XR) 500 MG 24 hr tablet Take 1 tablet (500 mg total) by mouth daily with breakfast. 05/30/16   Derrill Center, NP  metoprolol tartrate (LOPRESSOR) 25 MG tablet Take 1 tablet (25 mg total) by mouth 2 (two) times daily. 05/29/16   Derrill Center, NP  ondansetron (ZOFRAN ODT) 4 MG disintegrating tablet Take 1 tablet (4 mg total) by mouth every 8 (eight) hours as  needed for nausea or vomiting. 10/29/20   Little, Wenda Overland, MD  pioglitazone (ACTOS) 15 MG tablet Take 1 tablet (15 mg total) by mouth daily for 14 days. 05/17/19 05/31/19  McDonald, Mia A, PA-C  tamsulosin (FLOMAX) 0.4 MG CAPS capsule Take 1 capsule (0.4 mg total) by mouth daily. 10/29/20   Little, Wenda Overland, MD  testosterone cypionate (DEPOTESTOSTERONE CYPIONATE) 200 MG/ML injection Inject 200 mg into the muscle every 14 (fourteen) days.     [provider]  traZODone (DESYREL) 100 MG tablet Take 1 tablet (100 mg total) by mouth at bedtime. 05/29/16   Derrill Center, NP    Family History Family History  Problem Relation Age of Onset   Anxiety disorder Mother    Diabetes Father    Depression Father    Anxiety disorder Other    Mental illness Paternal Grandmother     Social History Social History   Tobacco Use   Smoking status: Never   Smokeless tobacco: Never  Substance Use Topics   Alcohol use: Never   Drug use: Never     Allergies   Penicillins   Review of Systems Review of Systems  Musculoskeletal:  Positive for back pain.  Neurological:  Positive for light-headedness.  All other systems reviewed and are negative.    Physical Exam Triage Vital Signs ED Triage Vitals  Enc Vitals Group     BP 03/05/22 1735 120/81     Pulse Rate 03/05/22 1735 74     Resp 03/05/22 1735 18     Temp 03/05/22 1735 98.8 F (37.1 C)     Temp src --      SpO2 --      Weight --      Height --      Head Circumference --      Peak Flow --      Pain Score 03/05/22 1734 6     Pain Loc --      Pain Edu? --      Excl. in [place]? --    No data found.  Updated Vital Signs BP 120/81   Pulse 74   Temp 98.8 F (37.1 C)   Resp 18   Visual Acuity Right Eye Distance:   Left Eye Distance:   Bilateral Distance:    Right Eye Near:   Left Eye Near:    Bilateral Near:     Physical Exam Vitals reviewed.  Constitutional:      General: He is not in acute distress.     Appearance: Normal appearance. He is not ill-appearing.  HENT:     Head: Normocephalic and atraumatic.  Eyes:     Extraocular Movements: Extraocular movements intact.     Pupils: Pupils are equal, round, and reactive to light.  Cardiovascular:     Rate and Rhythm: Normal rate and regular rhythm.     Heart sounds: Normal heart sounds.  Pulmonary:     Effort: Pulmonary effort is normal.  Breath sounds: Normal breath sounds. No wheezing, rhonchi or rales.  Musculoskeletal:     Cervical back: Normal range of motion and neck supple. No rigidity.     Comments: Bilateral lumbar paraspinous tenderness with flexion lumbar spine.  No midline spinous tenderness, deformity, step-off.  Lymphadenopathy:     Cervical: No cervical adenopathy.  Skin:    Capillary Refill: Capillary refill takes less than 2 seconds.  Neurological:     General: No focal deficit present.     Mental Status: He is alert and oriented to person, place, and time. Mental status is at baseline.     Cranial Nerves: No cranial nerve deficit or facial asymmetry.     Sensory: Sensation is intact. No sensory deficit.     Motor: Motor function is intact. No weakness.     Coordination: Coordination is intact. Coordination normal.     Gait: Gait is intact. Gait normal.     Comments: PERRLA, EOMI. CN 2-12 intact. No weakness or numbness in UEs or LEs. Strength and sensation intact. Negative rhomberg, pronator drift, fingers to thumbs.   Psychiatric:        Mood and Affect: Mood normal.        Behavior: Behavior normal.        Thought Content: Thought content normal.        Judgment: Judgment normal.      UC Treatments / Results  Labs (all labs ordered are listed, but only abnormal results are displayed) Labs Reviewed  POCT FASTING CBG [facility] MANUAL ENTRY - Abnormal; Notable for the following components:      Result Value   POCT Glucose ([facility]) 124 (*)    All other components within normal limits  POCT FASTING CBG [facility] MANUAL  ENTRY - Abnormal; Notable for the following components:   POCT Glucose ([facility]) 124 (*)    All other components within normal limits  POCT URINALYSIS DIP (MANUAL ENTRY)    EKG   Radiology No results found.  Procedures Procedures (including critical care time)  Medications Ordered in UC Medications - No data to display  Initial Impression / Assessment and Plan / UC Course  I have reviewed the triage vital signs and the nursing notes.  Pertinent labs & imaging results that were available during my care of the patient were reviewed by me and considered in my medical decision making (see chart for details).     This patient is a very pleasant 56 y.o. year old male presenting with lower back pain and lightheadedness for years. Neurologically intact.  The back pain is in the lumbar region, there is no flank pain  UA wnl, did not send culture.   Nonfasting CBG 124  He describes the lightheadedness as present for the last several years, possibly related to his diabetes.  He is followed closely by endocrinology and PCP for diabetes.  There are no new symptoms like headaches, vision changes, weakness, focal weakness, chest pain, shortness of breath.  Discussed that the lightheadedness is most likely related to the diabetes, but cannot exclude intracranial pathology in the urgent care setting.  Patient and wife (who was present entire visit) verbalized understanding and agreement.  They plan to follow-up with endocrinology next week, or head to the emergency department if symptoms acutely worsen over the weekend.  Final Clinical Impressions(s) / UC Diagnoses   Final diagnoses:  Acute bilateral low back pain without sciatica  Type 2 diabetes mellitus with other specified complication, with long-term current use of  insulin [facility])     Discharge Instructions      Our printer is broken. They will check MyChart for AVS.    ED Prescriptions   None    PDMP not reviewed this encounter.    Hazel Sams, PA-C 03/05/22 1819

## 2022-03-05 NOTE — ED Triage Notes (Signed)
Pt is p[present today with lower back pain and dizziness. Pt sx started x2 weeks ago.

## 2022-03-05 NOTE — Discharge Instructions (Signed)
Our printer is broken. They will check MyChart for AVS.

## 2022-04-13 ENCOUNTER — Ambulatory Visit
Admission: RE | Admit: 2022-04-13 | Discharge: 2022-04-13 | Disposition: A | Discharge status: Home / Self Care | Payer: Federal, State, Local not specified - PPO | Source: Ambulatory Visit | Attending: Orthopedic Surgery | Admitting: Orthopedic Surgery

## 2022-04-13 ENCOUNTER — Ambulatory Visit: Payer: Federal, State, Local not specified - PPO | Admitting: Neurology

## 2022-04-13 ENCOUNTER — Other Ambulatory Visit: Payer: Self-pay

## 2022-04-13 DIAGNOSIS — M25531 Pain in right wrist: Secondary | ICD-10-CM

## 2022-04-29 ENCOUNTER — Ambulatory Visit: Payer: Federal, State, Local not specified - PPO | Admitting: Neurology

## 2022-05-03 ENCOUNTER — Ambulatory Visit: Payer: Federal, State, Local not specified - PPO | Admitting: Neurology

## 2022-05-28 IMAGING — DX DG KNEE COMPLETE 4+V*L*
4 series · 4 of 4 positions shown · non-contrast
Comparison: None Available.

CLINICAL DATA: knee pain

EXAM:
LEFT KNEE - COMPLETE 4+ VIEW

[knee ap]
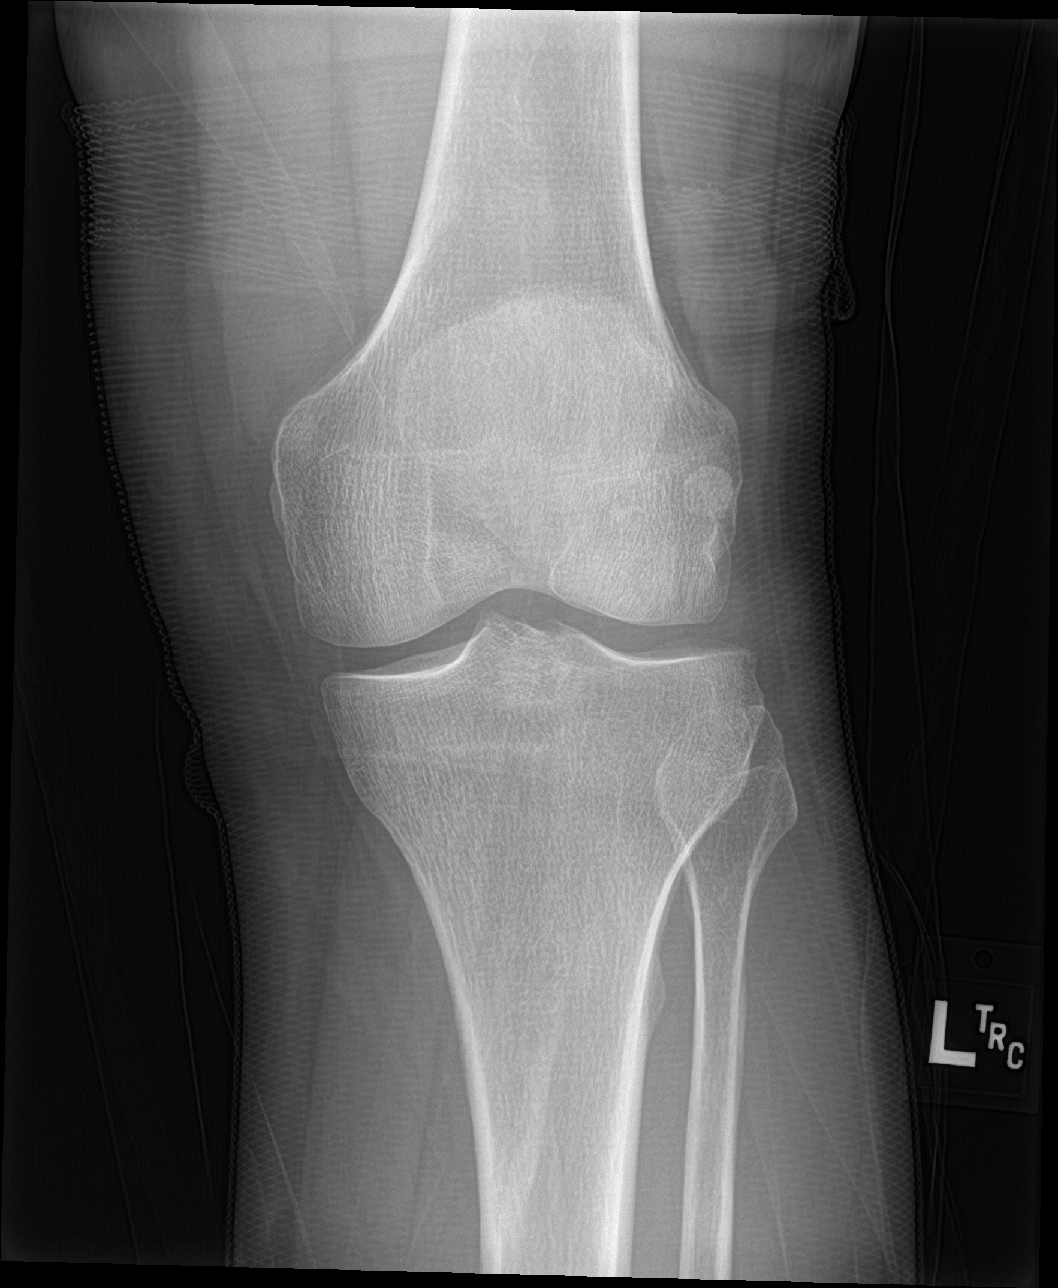

[knee lat]
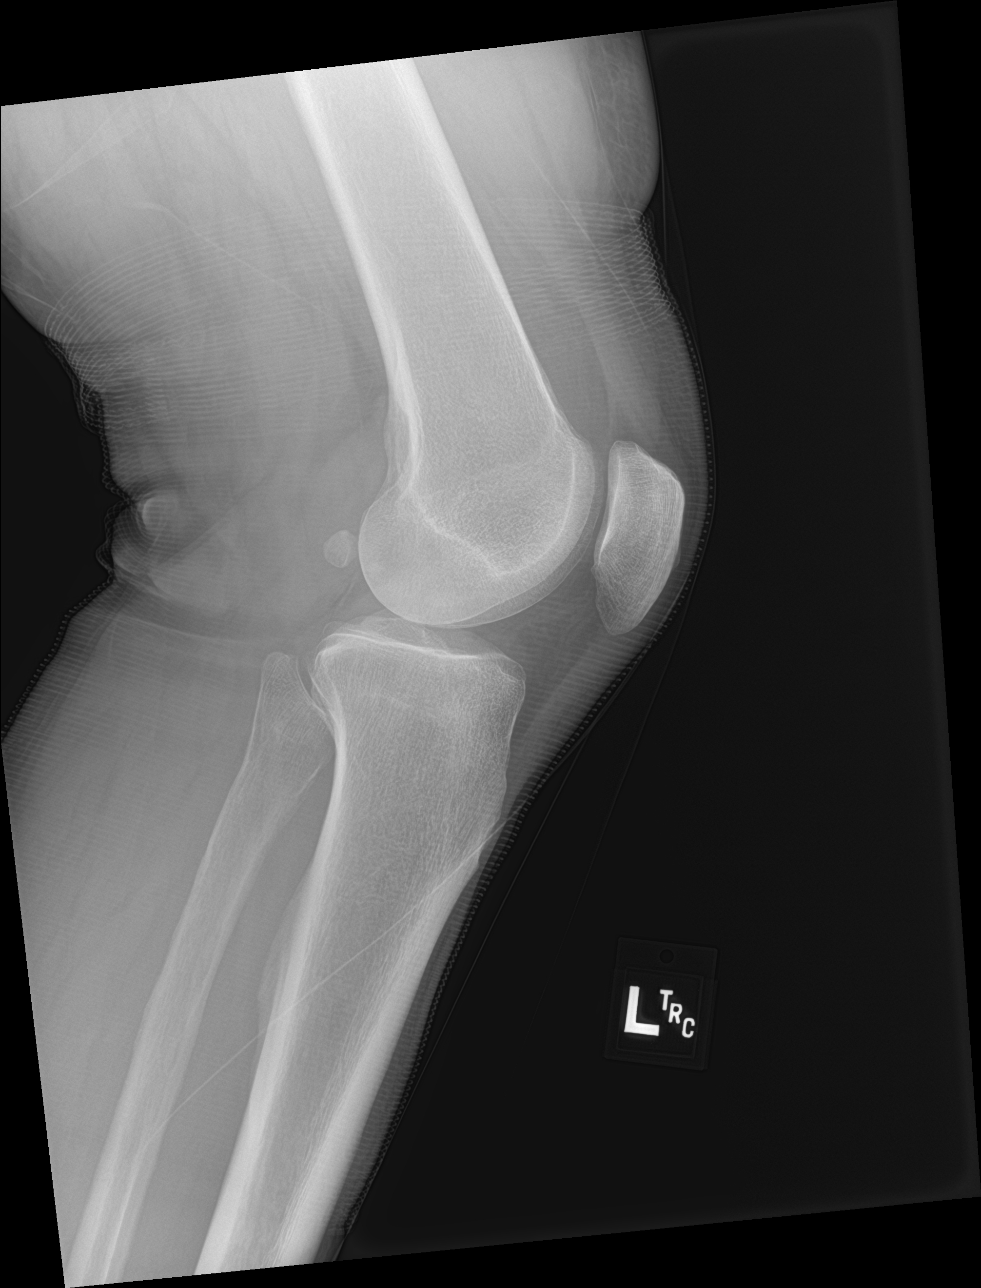

[knee obl (1 of 2)]
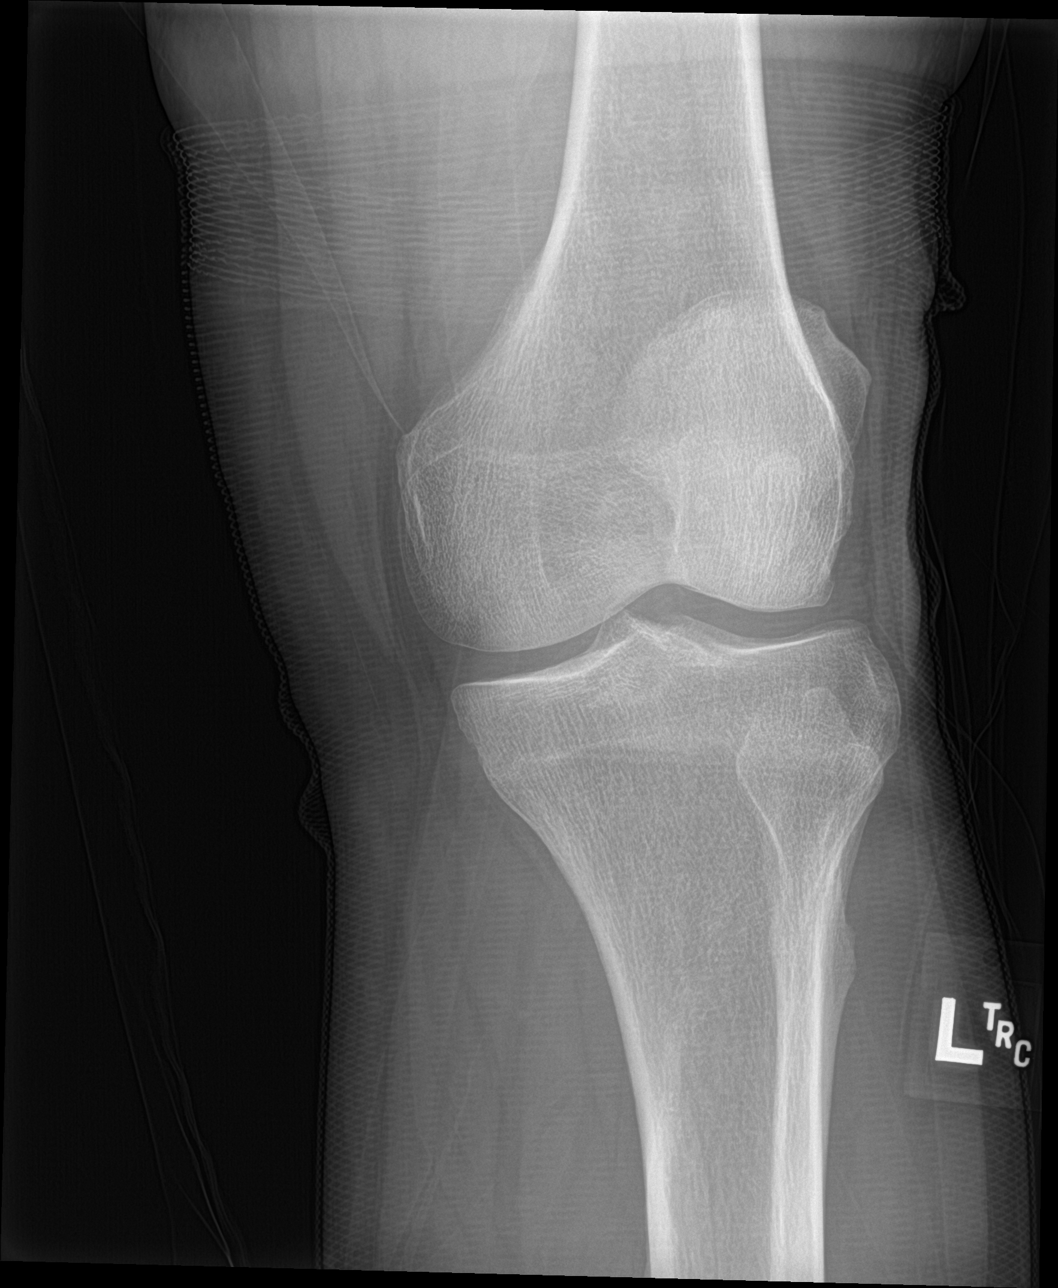

[knee obl (2 of 2)]
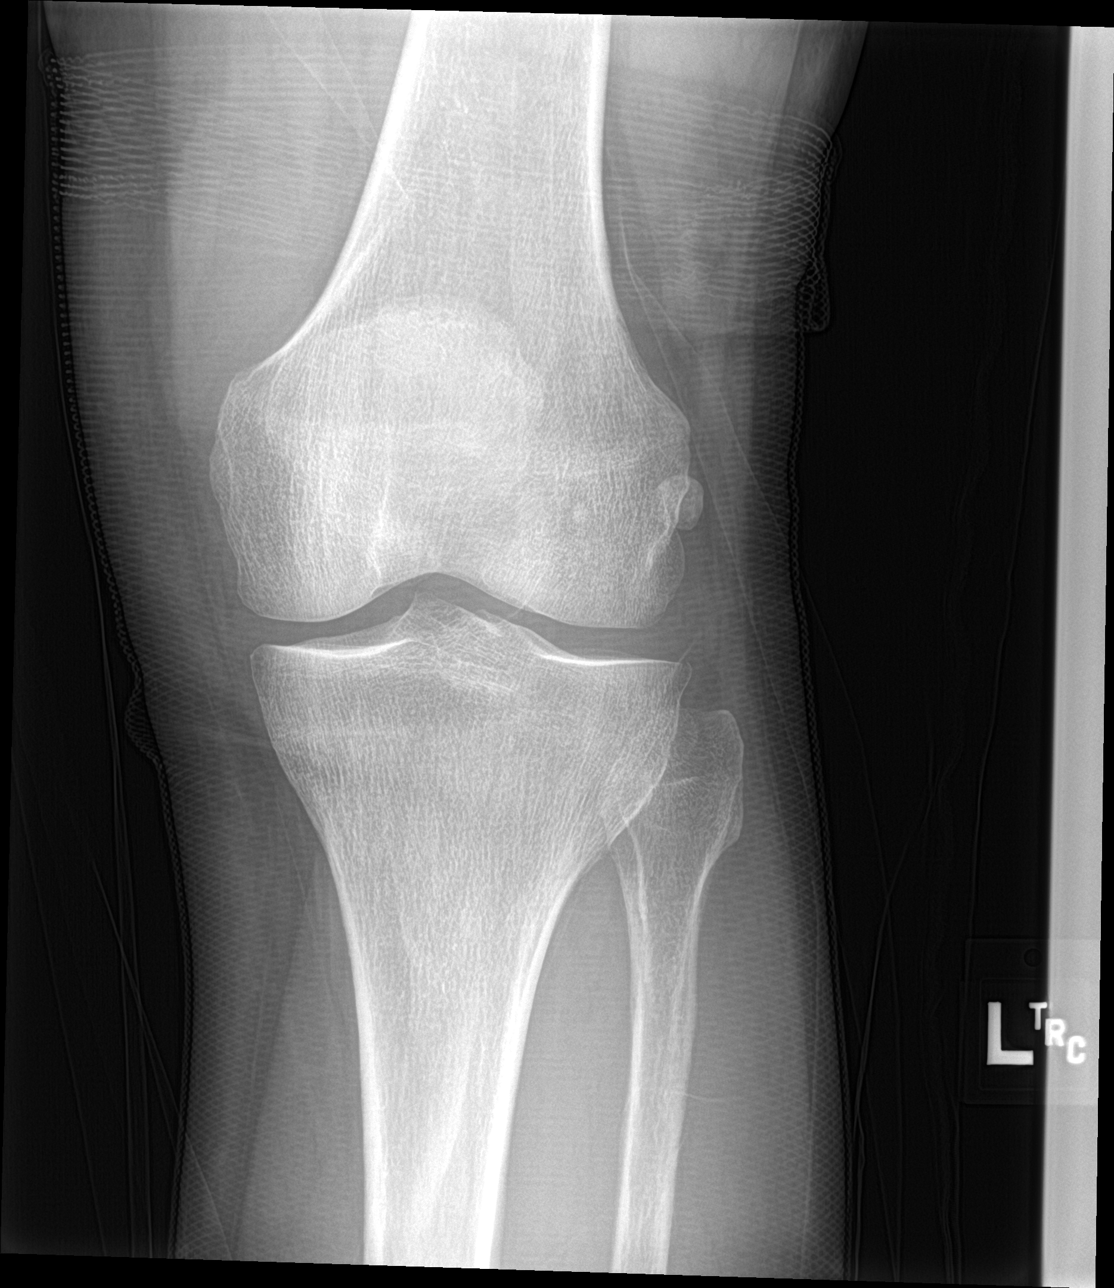

[4 of 4 positions shown; findings below may reference images not displayed]

FINDINGS: No acute fracture or dislocation. Joint spaces and alignment are
maintained. No area of erosion or osseous destruction. No unexpected
radiopaque foreign body. Soft tissues are unremarkable.
IMPRESSION: No acute fracture or dislocation.

## 2022-07-29 ENCOUNTER — Other Ambulatory Visit: Payer: Self-pay

## 2022-07-29 DIAGNOSIS — M25531 Pain in right wrist: Secondary | ICD-10-CM

## 2022-08-03 ENCOUNTER — Other Ambulatory Visit: Payer: Self-pay | Admitting: Orthopedic Surgery

## 2022-08-03 DIAGNOSIS — M25531 Pain in right wrist: Secondary | ICD-10-CM

## 2022-08-04 ENCOUNTER — Ambulatory Visit
Admission: RE | Admit: 2022-08-04 | Discharge: 2022-08-04 | Disposition: A | Discharge status: Home / Self Care | Payer: Federal, State, Local not specified - PPO | Source: Ambulatory Visit | Attending: Orthopedic Surgery | Admitting: Orthopedic Surgery

## 2022-08-04 DIAGNOSIS — M25531 Pain in right wrist: Secondary | ICD-10-CM
